# Patient Record
Sex: Female | Born: 1950 | Race: White | Hispanic: No | Smoking: Never smoker
Health system: Southern US, Community
[De-identification: ages and names within clinical notes are randomized; demographics above are authoritative.]

## PROBLEM LIST (undated history)

## (undated) DIAGNOSIS — G4700 Insomnia, unspecified: Secondary | ICD-10-CM

## (undated) DIAGNOSIS — I10 Essential (primary) hypertension: Secondary | ICD-10-CM

## (undated) HISTORY — DX: Insomnia, unspecified: G47.00

## (undated) HISTORY — DX: Essential (primary) hypertension: I10

---

## 1998-05-31 ENCOUNTER — Other Ambulatory Visit: Admission: RE | Admit: 1998-05-31 | Discharge: 1998-05-31 | Payer: Self-pay | Admitting: Obstetrics and Gynecology

## 1999-06-12 ENCOUNTER — Other Ambulatory Visit: Admission: RE | Admit: 1999-06-12 | Discharge: 1999-06-12 | Payer: Self-pay | Admitting: Obstetrics and Gynecology

## 1999-08-29 ENCOUNTER — Encounter: Admission: RE | Admit: 1999-08-29 | Discharge: 1999-08-29 | Payer: Self-pay | Admitting: Obstetrics and Gynecology

## 1999-08-29 ENCOUNTER — Encounter: Payer: Self-pay | Admitting: Obstetrics and Gynecology

## 2000-06-16 ENCOUNTER — Other Ambulatory Visit: Admission: RE | Admit: 2000-06-16 | Discharge: 2000-06-16 | Payer: Self-pay | Admitting: Obstetrics and Gynecology

## 2000-08-30 ENCOUNTER — Encounter: Payer: Self-pay | Admitting: Obstetrics and Gynecology

## 2000-08-30 ENCOUNTER — Encounter: Admission: RE | Admit: 2000-08-30 | Discharge: 2000-08-30 | Payer: Self-pay | Admitting: Obstetrics and Gynecology

## 2001-06-28 ENCOUNTER — Other Ambulatory Visit: Admission: RE | Admit: 2001-06-28 | Discharge: 2001-06-28 | Payer: Self-pay | Admitting: Obstetrics and Gynecology

## 2001-08-31 ENCOUNTER — Encounter: Payer: Self-pay | Admitting: Obstetrics and Gynecology

## 2001-08-31 ENCOUNTER — Encounter: Admission: RE | Admit: 2001-08-31 | Discharge: 2001-08-31 | Payer: Self-pay | Admitting: Obstetrics and Gynecology

## 2002-06-30 ENCOUNTER — Other Ambulatory Visit: Admission: RE | Admit: 2002-06-30 | Discharge: 2002-06-30 | Payer: Self-pay | Admitting: Obstetrics and Gynecology

## 2003-07-05 ENCOUNTER — Other Ambulatory Visit: Admission: RE | Admit: 2003-07-05 | Discharge: 2003-07-05 | Payer: Self-pay | Admitting: Obstetrics and Gynecology

## 2004-07-08 ENCOUNTER — Other Ambulatory Visit: Admission: RE | Admit: 2004-07-08 | Discharge: 2004-07-08 | Payer: Self-pay | Admitting: Obstetrics and Gynecology

## 2005-06-03 ENCOUNTER — Other Ambulatory Visit: Admission: RE | Admit: 2005-06-03 | Discharge: 2005-06-03 | Payer: Self-pay | Admitting: Obstetrics and Gynecology

## 2012-05-24 ENCOUNTER — Other Ambulatory Visit: Payer: Self-pay | Admitting: Obstetrics and Gynecology

## 2012-05-24 DIAGNOSIS — R928 Other abnormal and inconclusive findings on diagnostic imaging of breast: Secondary | ICD-10-CM

## 2012-05-26 ENCOUNTER — Ambulatory Visit
Admission: RE | Admit: 2012-05-26 | Discharge: 2012-05-26 | Disposition: A | Discharge status: Home / Self Care | Payer: BC Managed Care – PPO | Source: Ambulatory Visit | Attending: Obstetrics and Gynecology | Admitting: Obstetrics and Gynecology

## 2012-05-26 DIAGNOSIS — R928 Other abnormal and inconclusive findings on diagnostic imaging of breast: Secondary | ICD-10-CM

## 2015-09-17 DIAGNOSIS — M545 Low back pain: Secondary | ICD-10-CM | POA: Diagnosis not present

## 2015-09-17 DIAGNOSIS — F329 Major depressive disorder, single episode, unspecified: Secondary | ICD-10-CM | POA: Diagnosis not present

## 2015-09-17 DIAGNOSIS — R609 Edema, unspecified: Secondary | ICD-10-CM | POA: Diagnosis not present

## 2015-09-17 DIAGNOSIS — I10 Essential (primary) hypertension: Secondary | ICD-10-CM | POA: Diagnosis not present

## 2015-09-17 DIAGNOSIS — M159 Polyosteoarthritis, unspecified: Secondary | ICD-10-CM | POA: Diagnosis not present

## 2015-09-17 DIAGNOSIS — E7800 Pure hypercholesterolemia, unspecified: Secondary | ICD-10-CM | POA: Diagnosis not present

## 2015-09-17 DIAGNOSIS — K219 Gastro-esophageal reflux disease without esophagitis: Secondary | ICD-10-CM | POA: Diagnosis not present

## 2015-09-17 DIAGNOSIS — K581 Irritable bowel syndrome with constipation: Secondary | ICD-10-CM | POA: Diagnosis not present

## 2015-09-17 DIAGNOSIS — M25561 Pain in right knee: Secondary | ICD-10-CM | POA: Diagnosis not present

## 2015-09-17 DIAGNOSIS — G4700 Insomnia, unspecified: Secondary | ICD-10-CM | POA: Diagnosis not present

## 2015-09-17 DIAGNOSIS — E119 Type 2 diabetes mellitus without complications: Secondary | ICD-10-CM | POA: Diagnosis not present

## 2015-09-17 DIAGNOSIS — J309 Allergic rhinitis, unspecified: Secondary | ICD-10-CM | POA: Diagnosis not present

## 2015-10-17 DIAGNOSIS — G4700 Insomnia, unspecified: Secondary | ICD-10-CM | POA: Diagnosis not present

## 2015-10-17 DIAGNOSIS — R609 Edema, unspecified: Secondary | ICD-10-CM | POA: Diagnosis not present

## 2015-10-17 DIAGNOSIS — E119 Type 2 diabetes mellitus without complications: Secondary | ICD-10-CM | POA: Diagnosis not present

## 2015-10-17 DIAGNOSIS — I10 Essential (primary) hypertension: Secondary | ICD-10-CM | POA: Diagnosis not present

## 2015-10-17 DIAGNOSIS — M25561 Pain in right knee: Secondary | ICD-10-CM | POA: Diagnosis not present

## 2015-10-17 DIAGNOSIS — M159 Polyosteoarthritis, unspecified: Secondary | ICD-10-CM | POA: Diagnosis not present

## 2015-10-17 DIAGNOSIS — K581 Irritable bowel syndrome with constipation: Secondary | ICD-10-CM | POA: Diagnosis not present

## 2015-10-17 DIAGNOSIS — F329 Major depressive disorder, single episode, unspecified: Secondary | ICD-10-CM | POA: Diagnosis not present

## 2015-10-17 DIAGNOSIS — E7800 Pure hypercholesterolemia, unspecified: Secondary | ICD-10-CM | POA: Diagnosis not present

## 2015-10-17 DIAGNOSIS — K219 Gastro-esophageal reflux disease without esophagitis: Secondary | ICD-10-CM | POA: Diagnosis not present

## 2015-10-17 DIAGNOSIS — J309 Allergic rhinitis, unspecified: Secondary | ICD-10-CM | POA: Diagnosis not present

## 2015-10-17 DIAGNOSIS — M545 Low back pain: Secondary | ICD-10-CM | POA: Diagnosis not present

## 2015-12-19 DIAGNOSIS — R609 Edema, unspecified: Secondary | ICD-10-CM | POA: Diagnosis not present

## 2015-12-19 DIAGNOSIS — K581 Irritable bowel syndrome with constipation: Secondary | ICD-10-CM | POA: Diagnosis not present

## 2015-12-19 DIAGNOSIS — M159 Polyosteoarthritis, unspecified: Secondary | ICD-10-CM | POA: Diagnosis not present

## 2015-12-19 DIAGNOSIS — G4700 Insomnia, unspecified: Secondary | ICD-10-CM | POA: Diagnosis not present

## 2015-12-19 DIAGNOSIS — F419 Anxiety disorder, unspecified: Secondary | ICD-10-CM | POA: Diagnosis not present

## 2015-12-19 DIAGNOSIS — F329 Major depressive disorder, single episode, unspecified: Secondary | ICD-10-CM | POA: Diagnosis not present

## 2015-12-19 DIAGNOSIS — E119 Type 2 diabetes mellitus without complications: Secondary | ICD-10-CM | POA: Diagnosis not present

## 2015-12-19 DIAGNOSIS — E663 Overweight: Secondary | ICD-10-CM | POA: Diagnosis not present

## 2015-12-19 DIAGNOSIS — E7800 Pure hypercholesterolemia, unspecified: Secondary | ICD-10-CM | POA: Diagnosis not present

## 2015-12-19 DIAGNOSIS — I10 Essential (primary) hypertension: Secondary | ICD-10-CM | POA: Diagnosis not present

## 2015-12-19 DIAGNOSIS — K219 Gastro-esophageal reflux disease without esophagitis: Secondary | ICD-10-CM | POA: Diagnosis not present

## 2015-12-19 DIAGNOSIS — M545 Low back pain: Secondary | ICD-10-CM | POA: Diagnosis not present

## 2016-01-27 DIAGNOSIS — Z6829 Body mass index (BMI) 29.0-29.9, adult: Secondary | ICD-10-CM | POA: Diagnosis not present

## 2016-01-27 DIAGNOSIS — H1010 Acute atopic conjunctivitis, unspecified eye: Secondary | ICD-10-CM | POA: Diagnosis not present

## 2016-01-27 DIAGNOSIS — J309 Allergic rhinitis, unspecified: Secondary | ICD-10-CM | POA: Diagnosis not present

## 2016-01-31 DIAGNOSIS — M25561 Pain in right knee: Secondary | ICD-10-CM | POA: Diagnosis not present

## 2016-01-31 DIAGNOSIS — M2391 Unspecified internal derangement of right knee: Secondary | ICD-10-CM | POA: Diagnosis not present

## 2016-01-31 DIAGNOSIS — M94261 Chondromalacia, right knee: Secondary | ICD-10-CM | POA: Diagnosis not present

## 2016-02-05 DIAGNOSIS — N952 Postmenopausal atrophic vaginitis: Secondary | ICD-10-CM | POA: Diagnosis not present

## 2016-02-05 DIAGNOSIS — Z1231 Encounter for screening mammogram for malignant neoplasm of breast: Secondary | ICD-10-CM | POA: Diagnosis not present

## 2016-02-10 DIAGNOSIS — H02051 Trichiasis without entropian right upper eyelid: Secondary | ICD-10-CM | POA: Diagnosis not present

## 2016-02-24 DIAGNOSIS — M25551 Pain in right hip: Secondary | ICD-10-CM | POA: Diagnosis not present

## 2016-03-04 DIAGNOSIS — M94261 Chondromalacia, right knee: Secondary | ICD-10-CM | POA: Diagnosis not present

## 2016-03-24 DIAGNOSIS — F419 Anxiety disorder, unspecified: Secondary | ICD-10-CM | POA: Diagnosis not present

## 2016-03-24 DIAGNOSIS — K219 Gastro-esophageal reflux disease without esophagitis: Secondary | ICD-10-CM | POA: Diagnosis not present

## 2016-03-24 DIAGNOSIS — M159 Polyosteoarthritis, unspecified: Secondary | ICD-10-CM | POA: Diagnosis not present

## 2016-03-24 DIAGNOSIS — E119 Type 2 diabetes mellitus without complications: Secondary | ICD-10-CM | POA: Diagnosis not present

## 2016-03-24 DIAGNOSIS — I10 Essential (primary) hypertension: Secondary | ICD-10-CM | POA: Diagnosis not present

## 2016-03-24 DIAGNOSIS — R609 Edema, unspecified: Secondary | ICD-10-CM | POA: Diagnosis not present

## 2016-03-24 DIAGNOSIS — M545 Low back pain: Secondary | ICD-10-CM | POA: Diagnosis not present

## 2016-03-24 DIAGNOSIS — F329 Major depressive disorder, single episode, unspecified: Secondary | ICD-10-CM | POA: Diagnosis not present

## 2016-03-24 DIAGNOSIS — E7800 Pure hypercholesterolemia, unspecified: Secondary | ICD-10-CM | POA: Diagnosis not present

## 2016-03-24 DIAGNOSIS — K581 Irritable bowel syndrome with constipation: Secondary | ICD-10-CM | POA: Diagnosis not present

## 2016-03-24 DIAGNOSIS — G4700 Insomnia, unspecified: Secondary | ICD-10-CM | POA: Diagnosis not present

## 2016-03-24 DIAGNOSIS — E876 Hypokalemia: Secondary | ICD-10-CM | POA: Diagnosis not present

## 2016-04-22 DIAGNOSIS — M94261 Chondromalacia, right knee: Secondary | ICD-10-CM | POA: Diagnosis not present

## 2016-04-22 DIAGNOSIS — M7631 Iliotibial band syndrome, right leg: Secondary | ICD-10-CM | POA: Diagnosis not present

## 2016-06-30 DIAGNOSIS — G4700 Insomnia, unspecified: Secondary | ICD-10-CM | POA: Diagnosis not present

## 2016-06-30 DIAGNOSIS — K581 Irritable bowel syndrome with constipation: Secondary | ICD-10-CM | POA: Diagnosis not present

## 2016-06-30 DIAGNOSIS — M545 Low back pain: Secondary | ICD-10-CM | POA: Diagnosis not present

## 2016-06-30 DIAGNOSIS — K219 Gastro-esophageal reflux disease without esophagitis: Secondary | ICD-10-CM | POA: Diagnosis not present

## 2016-06-30 DIAGNOSIS — R609 Edema, unspecified: Secondary | ICD-10-CM | POA: Diagnosis not present

## 2016-06-30 DIAGNOSIS — E119 Type 2 diabetes mellitus without complications: Secondary | ICD-10-CM | POA: Diagnosis not present

## 2016-06-30 DIAGNOSIS — E876 Hypokalemia: Secondary | ICD-10-CM | POA: Diagnosis not present

## 2016-06-30 DIAGNOSIS — E7800 Pure hypercholesterolemia, unspecified: Secondary | ICD-10-CM | POA: Diagnosis not present

## 2016-06-30 DIAGNOSIS — M159 Polyosteoarthritis, unspecified: Secondary | ICD-10-CM | POA: Diagnosis not present

## 2016-06-30 DIAGNOSIS — F329 Major depressive disorder, single episode, unspecified: Secondary | ICD-10-CM | POA: Diagnosis not present

## 2016-06-30 DIAGNOSIS — I10 Essential (primary) hypertension: Secondary | ICD-10-CM | POA: Diagnosis not present

## 2016-06-30 DIAGNOSIS — F419 Anxiety disorder, unspecified: Secondary | ICD-10-CM | POA: Diagnosis not present

## 2016-07-14 DIAGNOSIS — E119 Type 2 diabetes mellitus without complications: Secondary | ICD-10-CM | POA: Diagnosis not present

## 2016-07-14 DIAGNOSIS — G4700 Insomnia, unspecified: Secondary | ICD-10-CM | POA: Diagnosis not present

## 2016-07-14 DIAGNOSIS — K219 Gastro-esophageal reflux disease without esophagitis: Secondary | ICD-10-CM | POA: Diagnosis not present

## 2016-07-14 DIAGNOSIS — M159 Polyosteoarthritis, unspecified: Secondary | ICD-10-CM | POA: Diagnosis not present

## 2016-07-14 DIAGNOSIS — K581 Irritable bowel syndrome with constipation: Secondary | ICD-10-CM | POA: Diagnosis not present

## 2016-07-14 DIAGNOSIS — M545 Low back pain: Secondary | ICD-10-CM | POA: Diagnosis not present

## 2016-07-14 DIAGNOSIS — Z9181 History of falling: Secondary | ICD-10-CM | POA: Diagnosis not present

## 2016-07-14 DIAGNOSIS — I10 Essential (primary) hypertension: Secondary | ICD-10-CM | POA: Diagnosis not present

## 2016-07-14 DIAGNOSIS — E7800 Pure hypercholesterolemia, unspecified: Secondary | ICD-10-CM | POA: Diagnosis not present

## 2016-07-14 DIAGNOSIS — R609 Edema, unspecified: Secondary | ICD-10-CM | POA: Diagnosis not present

## 2016-07-14 DIAGNOSIS — F329 Major depressive disorder, single episode, unspecified: Secondary | ICD-10-CM | POA: Diagnosis not present

## 2016-07-14 DIAGNOSIS — E876 Hypokalemia: Secondary | ICD-10-CM | POA: Diagnosis not present

## 2016-08-05 DIAGNOSIS — M25561 Pain in right knee: Secondary | ICD-10-CM | POA: Diagnosis not present

## 2016-08-05 DIAGNOSIS — M2391 Unspecified internal derangement of right knee: Secondary | ICD-10-CM | POA: Diagnosis not present

## 2016-08-05 DIAGNOSIS — M222X1 Patellofemoral disorders, right knee: Secondary | ICD-10-CM | POA: Diagnosis not present

## 2016-09-15 DIAGNOSIS — Z1211 Encounter for screening for malignant neoplasm of colon: Secondary | ICD-10-CM | POA: Diagnosis not present

## 2016-09-15 DIAGNOSIS — Z8371 Family history of colonic polyps: Secondary | ICD-10-CM | POA: Diagnosis not present

## 2016-09-15 DIAGNOSIS — Z86010 Personal history of colonic polyps: Secondary | ICD-10-CM | POA: Diagnosis not present

## 2016-10-07 DIAGNOSIS — K649 Unspecified hemorrhoids: Secondary | ICD-10-CM | POA: Diagnosis not present

## 2016-10-07 DIAGNOSIS — Z8371 Family history of colonic polyps: Secondary | ICD-10-CM | POA: Diagnosis not present

## 2016-10-07 DIAGNOSIS — Z1211 Encounter for screening for malignant neoplasm of colon: Secondary | ICD-10-CM | POA: Diagnosis not present

## 2016-10-07 DIAGNOSIS — K5730 Diverticulosis of large intestine without perforation or abscess without bleeding: Secondary | ICD-10-CM | POA: Diagnosis not present

## 2016-10-07 DIAGNOSIS — K644 Residual hemorrhoidal skin tags: Secondary | ICD-10-CM | POA: Diagnosis not present

## 2016-10-07 DIAGNOSIS — Z86010 Personal history of colonic polyps: Secondary | ICD-10-CM | POA: Diagnosis not present

## 2016-10-28 DIAGNOSIS — E119 Type 2 diabetes mellitus without complications: Secondary | ICD-10-CM | POA: Diagnosis not present

## 2016-10-28 DIAGNOSIS — F329 Major depressive disorder, single episode, unspecified: Secondary | ICD-10-CM | POA: Diagnosis not present

## 2016-10-28 DIAGNOSIS — K581 Irritable bowel syndrome with constipation: Secondary | ICD-10-CM | POA: Diagnosis not present

## 2016-10-28 DIAGNOSIS — M545 Low back pain: Secondary | ICD-10-CM | POA: Diagnosis not present

## 2016-10-28 DIAGNOSIS — F419 Anxiety disorder, unspecified: Secondary | ICD-10-CM | POA: Diagnosis not present

## 2016-10-28 DIAGNOSIS — K219 Gastro-esophageal reflux disease without esophagitis: Secondary | ICD-10-CM | POA: Diagnosis not present

## 2016-10-28 DIAGNOSIS — E876 Hypokalemia: Secondary | ICD-10-CM | POA: Diagnosis not present

## 2016-10-28 DIAGNOSIS — E7800 Pure hypercholesterolemia, unspecified: Secondary | ICD-10-CM | POA: Diagnosis not present

## 2016-10-28 DIAGNOSIS — R609 Edema, unspecified: Secondary | ICD-10-CM | POA: Diagnosis not present

## 2016-10-28 DIAGNOSIS — I10 Essential (primary) hypertension: Secondary | ICD-10-CM | POA: Diagnosis not present

## 2016-10-28 DIAGNOSIS — G4700 Insomnia, unspecified: Secondary | ICD-10-CM | POA: Diagnosis not present

## 2016-10-28 DIAGNOSIS — M159 Polyosteoarthritis, unspecified: Secondary | ICD-10-CM | POA: Diagnosis not present

## 2017-02-01 DIAGNOSIS — K219 Gastro-esophageal reflux disease without esophagitis: Secondary | ICD-10-CM | POA: Diagnosis not present

## 2017-02-01 DIAGNOSIS — M159 Polyosteoarthritis, unspecified: Secondary | ICD-10-CM | POA: Diagnosis not present

## 2017-02-01 DIAGNOSIS — G4489 Other headache syndrome: Secondary | ICD-10-CM | POA: Diagnosis not present

## 2017-02-01 DIAGNOSIS — R609 Edema, unspecified: Secondary | ICD-10-CM | POA: Diagnosis not present

## 2017-02-01 DIAGNOSIS — K581 Irritable bowel syndrome with constipation: Secondary | ICD-10-CM | POA: Diagnosis not present

## 2017-02-01 DIAGNOSIS — E7800 Pure hypercholesterolemia, unspecified: Secondary | ICD-10-CM | POA: Diagnosis not present

## 2017-02-01 DIAGNOSIS — F419 Anxiety disorder, unspecified: Secondary | ICD-10-CM | POA: Diagnosis not present

## 2017-02-01 DIAGNOSIS — E119 Type 2 diabetes mellitus without complications: Secondary | ICD-10-CM | POA: Diagnosis not present

## 2017-02-01 DIAGNOSIS — E876 Hypokalemia: Secondary | ICD-10-CM | POA: Diagnosis not present

## 2017-02-01 DIAGNOSIS — G4700 Insomnia, unspecified: Secondary | ICD-10-CM | POA: Diagnosis not present

## 2017-02-01 DIAGNOSIS — M545 Low back pain: Secondary | ICD-10-CM | POA: Diagnosis not present

## 2017-02-01 DIAGNOSIS — I10 Essential (primary) hypertension: Secondary | ICD-10-CM | POA: Diagnosis not present

## 2017-02-01 DIAGNOSIS — F329 Major depressive disorder, single episode, unspecified: Secondary | ICD-10-CM | POA: Diagnosis not present

## 2017-02-08 DIAGNOSIS — Z1231 Encounter for screening mammogram for malignant neoplasm of breast: Secondary | ICD-10-CM | POA: Diagnosis not present

## 2017-02-08 DIAGNOSIS — N952 Postmenopausal atrophic vaginitis: Secondary | ICD-10-CM | POA: Diagnosis not present

## 2017-02-10 DIAGNOSIS — M2391 Unspecified internal derangement of right knee: Secondary | ICD-10-CM | POA: Diagnosis not present

## 2017-02-10 DIAGNOSIS — M94261 Chondromalacia, right knee: Secondary | ICD-10-CM | POA: Diagnosis not present

## 2017-02-10 DIAGNOSIS — M25561 Pain in right knee: Secondary | ICD-10-CM | POA: Diagnosis not present

## 2017-02-10 DIAGNOSIS — M25562 Pain in left knee: Secondary | ICD-10-CM | POA: Diagnosis not present

## 2017-02-15 DIAGNOSIS — H02052 Trichiasis without entropian right lower eyelid: Secondary | ICD-10-CM | POA: Diagnosis not present

## 2017-05-04 DIAGNOSIS — M159 Polyosteoarthritis, unspecified: Secondary | ICD-10-CM | POA: Diagnosis not present

## 2017-05-04 DIAGNOSIS — I10 Essential (primary) hypertension: Secondary | ICD-10-CM | POA: Diagnosis not present

## 2017-05-04 DIAGNOSIS — K219 Gastro-esophageal reflux disease without esophagitis: Secondary | ICD-10-CM | POA: Diagnosis not present

## 2017-05-04 DIAGNOSIS — K581 Irritable bowel syndrome with constipation: Secondary | ICD-10-CM | POA: Diagnosis not present

## 2017-05-04 DIAGNOSIS — M545 Low back pain: Secondary | ICD-10-CM | POA: Diagnosis not present

## 2017-05-04 DIAGNOSIS — R609 Edema, unspecified: Secondary | ICD-10-CM | POA: Diagnosis not present

## 2017-05-04 DIAGNOSIS — F329 Major depressive disorder, single episode, unspecified: Secondary | ICD-10-CM | POA: Diagnosis not present

## 2017-05-04 DIAGNOSIS — E876 Hypokalemia: Secondary | ICD-10-CM | POA: Diagnosis not present

## 2017-05-04 DIAGNOSIS — G4700 Insomnia, unspecified: Secondary | ICD-10-CM | POA: Diagnosis not present

## 2017-05-04 DIAGNOSIS — E1140 Type 2 diabetes mellitus with diabetic neuropathy, unspecified: Secondary | ICD-10-CM | POA: Diagnosis not present

## 2017-05-04 DIAGNOSIS — G43909 Migraine, unspecified, not intractable, without status migrainosus: Secondary | ICD-10-CM | POA: Diagnosis not present

## 2017-05-04 DIAGNOSIS — E7800 Pure hypercholesterolemia, unspecified: Secondary | ICD-10-CM | POA: Diagnosis not present

## 2017-05-04 DIAGNOSIS — E119 Type 2 diabetes mellitus without complications: Secondary | ICD-10-CM | POA: Diagnosis not present

## 2017-08-03 DIAGNOSIS — I10 Essential (primary) hypertension: Secondary | ICD-10-CM | POA: Diagnosis not present

## 2017-08-03 DIAGNOSIS — G4700 Insomnia, unspecified: Secondary | ICD-10-CM | POA: Diagnosis not present

## 2017-08-03 DIAGNOSIS — M159 Polyosteoarthritis, unspecified: Secondary | ICD-10-CM | POA: Diagnosis not present

## 2017-08-03 DIAGNOSIS — R609 Edema, unspecified: Secondary | ICD-10-CM | POA: Diagnosis not present

## 2017-08-03 DIAGNOSIS — E119 Type 2 diabetes mellitus without complications: Secondary | ICD-10-CM | POA: Diagnosis not present

## 2017-08-03 DIAGNOSIS — F329 Major depressive disorder, single episode, unspecified: Secondary | ICD-10-CM | POA: Diagnosis not present

## 2017-08-03 DIAGNOSIS — J309 Allergic rhinitis, unspecified: Secondary | ICD-10-CM | POA: Diagnosis not present

## 2017-08-03 DIAGNOSIS — K219 Gastro-esophageal reflux disease without esophagitis: Secondary | ICD-10-CM | POA: Diagnosis not present

## 2017-08-03 DIAGNOSIS — E876 Hypokalemia: Secondary | ICD-10-CM | POA: Diagnosis not present

## 2017-08-03 DIAGNOSIS — K581 Irritable bowel syndrome with constipation: Secondary | ICD-10-CM | POA: Diagnosis not present

## 2017-08-03 DIAGNOSIS — E7800 Pure hypercholesterolemia, unspecified: Secondary | ICD-10-CM | POA: Diagnosis not present

## 2017-08-03 DIAGNOSIS — G43909 Migraine, unspecified, not intractable, without status migrainosus: Secondary | ICD-10-CM | POA: Diagnosis not present

## 2017-08-03 DIAGNOSIS — M545 Low back pain: Secondary | ICD-10-CM | POA: Diagnosis not present

## 2017-08-31 DIAGNOSIS — I10 Essential (primary) hypertension: Secondary | ICD-10-CM | POA: Diagnosis not present

## 2017-08-31 DIAGNOSIS — E1140 Type 2 diabetes mellitus with diabetic neuropathy, unspecified: Secondary | ICD-10-CM | POA: Diagnosis not present

## 2017-08-31 DIAGNOSIS — J208 Acute bronchitis due to other specified organisms: Secondary | ICD-10-CM | POA: Diagnosis not present

## 2017-08-31 DIAGNOSIS — E119 Type 2 diabetes mellitus without complications: Secondary | ICD-10-CM | POA: Diagnosis not present

## 2017-08-31 DIAGNOSIS — E876 Hypokalemia: Secondary | ICD-10-CM | POA: Diagnosis not present

## 2017-08-31 DIAGNOSIS — M159 Polyosteoarthritis, unspecified: Secondary | ICD-10-CM | POA: Diagnosis not present

## 2017-08-31 DIAGNOSIS — G4700 Insomnia, unspecified: Secondary | ICD-10-CM | POA: Diagnosis not present

## 2017-08-31 DIAGNOSIS — M545 Low back pain: Secondary | ICD-10-CM | POA: Diagnosis not present

## 2017-08-31 DIAGNOSIS — F329 Major depressive disorder, single episode, unspecified: Secondary | ICD-10-CM | POA: Diagnosis not present

## 2017-08-31 DIAGNOSIS — K219 Gastro-esophageal reflux disease without esophagitis: Secondary | ICD-10-CM | POA: Diagnosis not present

## 2017-08-31 DIAGNOSIS — K581 Irritable bowel syndrome with constipation: Secondary | ICD-10-CM | POA: Diagnosis not present

## 2017-08-31 DIAGNOSIS — G43909 Migraine, unspecified, not intractable, without status migrainosus: Secondary | ICD-10-CM | POA: Diagnosis not present

## 2017-10-06 DIAGNOSIS — K136 Irritative hyperplasia of oral mucosa: Secondary | ICD-10-CM | POA: Diagnosis not present

## 2017-10-15 DIAGNOSIS — K136 Irritative hyperplasia of oral mucosa: Secondary | ICD-10-CM | POA: Diagnosis not present

## 2017-11-02 DIAGNOSIS — M545 Low back pain: Secondary | ICD-10-CM | POA: Diagnosis not present

## 2017-11-02 DIAGNOSIS — F419 Anxiety disorder, unspecified: Secondary | ICD-10-CM | POA: Diagnosis not present

## 2017-11-02 DIAGNOSIS — Z1331 Encounter for screening for depression: Secondary | ICD-10-CM | POA: Diagnosis not present

## 2017-11-02 DIAGNOSIS — I10 Essential (primary) hypertension: Secondary | ICD-10-CM | POA: Diagnosis not present

## 2017-11-02 DIAGNOSIS — Z9181 History of falling: Secondary | ICD-10-CM | POA: Diagnosis not present

## 2017-11-02 DIAGNOSIS — M159 Polyosteoarthritis, unspecified: Secondary | ICD-10-CM | POA: Diagnosis not present

## 2017-11-02 DIAGNOSIS — E7800 Pure hypercholesterolemia, unspecified: Secondary | ICD-10-CM | POA: Diagnosis not present

## 2017-11-02 DIAGNOSIS — Z6829 Body mass index (BMI) 29.0-29.9, adult: Secondary | ICD-10-CM | POA: Diagnosis not present

## 2017-11-02 DIAGNOSIS — F329 Major depressive disorder, single episode, unspecified: Secondary | ICD-10-CM | POA: Diagnosis not present

## 2017-11-02 DIAGNOSIS — E119 Type 2 diabetes mellitus without complications: Secondary | ICD-10-CM | POA: Diagnosis not present

## 2017-11-02 DIAGNOSIS — E876 Hypokalemia: Secondary | ICD-10-CM | POA: Diagnosis not present

## 2017-11-09 DIAGNOSIS — Z9181 History of falling: Secondary | ICD-10-CM | POA: Diagnosis not present

## 2017-11-09 DIAGNOSIS — Z1331 Encounter for screening for depression: Secondary | ICD-10-CM | POA: Diagnosis not present

## 2017-11-09 DIAGNOSIS — E119 Type 2 diabetes mellitus without complications: Secondary | ICD-10-CM | POA: Diagnosis not present

## 2017-11-09 DIAGNOSIS — E7800 Pure hypercholesterolemia, unspecified: Secondary | ICD-10-CM | POA: Diagnosis not present

## 2017-11-09 DIAGNOSIS — E1140 Type 2 diabetes mellitus with diabetic neuropathy, unspecified: Secondary | ICD-10-CM | POA: Diagnosis not present

## 2017-11-09 DIAGNOSIS — G4700 Insomnia, unspecified: Secondary | ICD-10-CM | POA: Diagnosis not present

## 2017-11-09 DIAGNOSIS — I10 Essential (primary) hypertension: Secondary | ICD-10-CM | POA: Diagnosis not present

## 2017-11-09 DIAGNOSIS — E876 Hypokalemia: Secondary | ICD-10-CM | POA: Diagnosis not present

## 2017-11-09 DIAGNOSIS — M545 Low back pain: Secondary | ICD-10-CM | POA: Diagnosis not present

## 2017-11-09 DIAGNOSIS — R5382 Chronic fatigue, unspecified: Secondary | ICD-10-CM | POA: Diagnosis not present

## 2017-11-09 DIAGNOSIS — G43909 Migraine, unspecified, not intractable, without status migrainosus: Secondary | ICD-10-CM | POA: Diagnosis not present

## 2017-11-09 DIAGNOSIS — M159 Polyosteoarthritis, unspecified: Secondary | ICD-10-CM | POA: Diagnosis not present

## 2017-11-09 DIAGNOSIS — K219 Gastro-esophageal reflux disease without esophagitis: Secondary | ICD-10-CM | POA: Diagnosis not present

## 2017-11-11 DIAGNOSIS — M159 Polyosteoarthritis, unspecified: Secondary | ICD-10-CM | POA: Diagnosis not present

## 2017-11-11 DIAGNOSIS — E1140 Type 2 diabetes mellitus with diabetic neuropathy, unspecified: Secondary | ICD-10-CM | POA: Diagnosis not present

## 2017-11-11 DIAGNOSIS — J0100 Acute maxillary sinusitis, unspecified: Secondary | ICD-10-CM | POA: Diagnosis not present

## 2017-11-11 DIAGNOSIS — G4700 Insomnia, unspecified: Secondary | ICD-10-CM | POA: Diagnosis not present

## 2017-11-11 DIAGNOSIS — F329 Major depressive disorder, single episode, unspecified: Secondary | ICD-10-CM | POA: Diagnosis not present

## 2017-11-11 DIAGNOSIS — K219 Gastro-esophageal reflux disease without esophagitis: Secondary | ICD-10-CM | POA: Diagnosis not present

## 2017-11-11 DIAGNOSIS — M545 Low back pain: Secondary | ICD-10-CM | POA: Diagnosis not present

## 2017-11-11 DIAGNOSIS — R609 Edema, unspecified: Secondary | ICD-10-CM | POA: Diagnosis not present

## 2017-11-11 DIAGNOSIS — K581 Irritable bowel syndrome with constipation: Secondary | ICD-10-CM | POA: Diagnosis not present

## 2017-11-11 DIAGNOSIS — G43909 Migraine, unspecified, not intractable, without status migrainosus: Secondary | ICD-10-CM | POA: Diagnosis not present

## 2017-11-11 DIAGNOSIS — R42 Dizziness and giddiness: Secondary | ICD-10-CM | POA: Diagnosis not present

## 2017-11-11 DIAGNOSIS — E876 Hypokalemia: Secondary | ICD-10-CM | POA: Diagnosis not present

## 2017-12-07 DIAGNOSIS — K581 Irritable bowel syndrome with constipation: Secondary | ICD-10-CM | POA: Diagnosis not present

## 2017-12-07 DIAGNOSIS — E1140 Type 2 diabetes mellitus with diabetic neuropathy, unspecified: Secondary | ICD-10-CM | POA: Diagnosis not present

## 2017-12-07 DIAGNOSIS — I10 Essential (primary) hypertension: Secondary | ICD-10-CM | POA: Diagnosis not present

## 2017-12-07 DIAGNOSIS — F329 Major depressive disorder, single episode, unspecified: Secondary | ICD-10-CM | POA: Diagnosis not present

## 2017-12-07 DIAGNOSIS — R609 Edema, unspecified: Secondary | ICD-10-CM | POA: Diagnosis not present

## 2017-12-07 DIAGNOSIS — G4700 Insomnia, unspecified: Secondary | ICD-10-CM | POA: Diagnosis not present

## 2017-12-07 DIAGNOSIS — K219 Gastro-esophageal reflux disease without esophagitis: Secondary | ICD-10-CM | POA: Diagnosis not present

## 2017-12-07 DIAGNOSIS — M545 Low back pain: Secondary | ICD-10-CM | POA: Diagnosis not present

## 2017-12-07 DIAGNOSIS — E119 Type 2 diabetes mellitus without complications: Secondary | ICD-10-CM | POA: Diagnosis not present

## 2017-12-07 DIAGNOSIS — G43909 Migraine, unspecified, not intractable, without status migrainosus: Secondary | ICD-10-CM | POA: Diagnosis not present

## 2017-12-07 DIAGNOSIS — M159 Polyosteoarthritis, unspecified: Secondary | ICD-10-CM | POA: Diagnosis not present

## 2017-12-07 DIAGNOSIS — E876 Hypokalemia: Secondary | ICD-10-CM | POA: Diagnosis not present

## 2017-12-15 DIAGNOSIS — E119 Type 2 diabetes mellitus without complications: Secondary | ICD-10-CM | POA: Diagnosis not present

## 2017-12-15 DIAGNOSIS — M545 Low back pain: Secondary | ICD-10-CM | POA: Diagnosis not present

## 2017-12-15 DIAGNOSIS — N289 Disorder of kidney and ureter, unspecified: Secondary | ICD-10-CM | POA: Diagnosis not present

## 2017-12-15 DIAGNOSIS — G4700 Insomnia, unspecified: Secondary | ICD-10-CM | POA: Diagnosis not present

## 2017-12-15 DIAGNOSIS — M159 Polyosteoarthritis, unspecified: Secondary | ICD-10-CM | POA: Diagnosis not present

## 2017-12-15 DIAGNOSIS — F329 Major depressive disorder, single episode, unspecified: Secondary | ICD-10-CM | POA: Diagnosis not present

## 2017-12-15 DIAGNOSIS — G43909 Migraine, unspecified, not intractable, without status migrainosus: Secondary | ICD-10-CM | POA: Diagnosis not present

## 2017-12-15 DIAGNOSIS — I10 Essential (primary) hypertension: Secondary | ICD-10-CM | POA: Diagnosis not present

## 2017-12-15 DIAGNOSIS — E1140 Type 2 diabetes mellitus with diabetic neuropathy, unspecified: Secondary | ICD-10-CM | POA: Diagnosis not present

## 2017-12-15 DIAGNOSIS — K219 Gastro-esophageal reflux disease without esophagitis: Secondary | ICD-10-CM | POA: Diagnosis not present

## 2017-12-15 DIAGNOSIS — Z23 Encounter for immunization: Secondary | ICD-10-CM | POA: Diagnosis not present

## 2017-12-15 DIAGNOSIS — E876 Hypokalemia: Secondary | ICD-10-CM | POA: Diagnosis not present

## 2018-02-10 DIAGNOSIS — Z1231 Encounter for screening mammogram for malignant neoplasm of breast: Secondary | ICD-10-CM | POA: Diagnosis not present

## 2018-03-08 DIAGNOSIS — I10 Essential (primary) hypertension: Secondary | ICD-10-CM | POA: Diagnosis not present

## 2018-03-08 DIAGNOSIS — Z1331 Encounter for screening for depression: Secondary | ICD-10-CM | POA: Diagnosis not present

## 2018-03-08 DIAGNOSIS — Z9181 History of falling: Secondary | ICD-10-CM | POA: Diagnosis not present

## 2018-03-08 DIAGNOSIS — E785 Hyperlipidemia, unspecified: Secondary | ICD-10-CM | POA: Diagnosis not present

## 2018-03-08 DIAGNOSIS — K219 Gastro-esophageal reflux disease without esophagitis: Secondary | ICD-10-CM | POA: Diagnosis not present

## 2018-03-08 DIAGNOSIS — N259 Disorder resulting from impaired renal tubular function, unspecified: Secondary | ICD-10-CM | POA: Diagnosis not present

## 2018-03-08 DIAGNOSIS — M159 Polyosteoarthritis, unspecified: Secondary | ICD-10-CM | POA: Diagnosis not present

## 2018-03-08 DIAGNOSIS — Z139 Encounter for screening, unspecified: Secondary | ICD-10-CM | POA: Diagnosis not present

## 2018-03-08 DIAGNOSIS — E7800 Pure hypercholesterolemia, unspecified: Secondary | ICD-10-CM | POA: Diagnosis not present

## 2018-03-08 DIAGNOSIS — E876 Hypokalemia: Secondary | ICD-10-CM | POA: Diagnosis not present

## 2018-03-08 DIAGNOSIS — Z0000 Encounter for general adult medical examination without abnormal findings: Secondary | ICD-10-CM | POA: Diagnosis not present

## 2018-03-08 DIAGNOSIS — E119 Type 2 diabetes mellitus without complications: Secondary | ICD-10-CM | POA: Diagnosis not present

## 2018-03-08 DIAGNOSIS — E1140 Type 2 diabetes mellitus with diabetic neuropathy, unspecified: Secondary | ICD-10-CM | POA: Diagnosis not present

## 2018-05-02 DIAGNOSIS — G43909 Migraine, unspecified, not intractable, without status migrainosus: Secondary | ICD-10-CM | POA: Diagnosis not present

## 2018-05-02 DIAGNOSIS — K581 Irritable bowel syndrome with constipation: Secondary | ICD-10-CM | POA: Diagnosis not present

## 2018-05-02 DIAGNOSIS — E876 Hypokalemia: Secondary | ICD-10-CM | POA: Diagnosis not present

## 2018-05-02 DIAGNOSIS — E1140 Type 2 diabetes mellitus with diabetic neuropathy, unspecified: Secondary | ICD-10-CM | POA: Diagnosis not present

## 2018-05-02 DIAGNOSIS — N289 Disorder of kidney and ureter, unspecified: Secondary | ICD-10-CM | POA: Diagnosis not present

## 2018-05-02 DIAGNOSIS — R609 Edema, unspecified: Secondary | ICD-10-CM | POA: Diagnosis not present

## 2018-05-02 DIAGNOSIS — G4700 Insomnia, unspecified: Secondary | ICD-10-CM | POA: Diagnosis not present

## 2018-05-02 DIAGNOSIS — K219 Gastro-esophageal reflux disease without esophagitis: Secondary | ICD-10-CM | POA: Diagnosis not present

## 2018-05-02 DIAGNOSIS — F329 Major depressive disorder, single episode, unspecified: Secondary | ICD-10-CM | POA: Diagnosis not present

## 2018-05-02 DIAGNOSIS — M159 Polyosteoarthritis, unspecified: Secondary | ICD-10-CM | POA: Diagnosis not present

## 2018-05-02 DIAGNOSIS — J0100 Acute maxillary sinusitis, unspecified: Secondary | ICD-10-CM | POA: Diagnosis not present

## 2018-05-02 DIAGNOSIS — M545 Low back pain: Secondary | ICD-10-CM | POA: Diagnosis not present

## 2018-06-08 DIAGNOSIS — E7800 Pure hypercholesterolemia, unspecified: Secondary | ICD-10-CM | POA: Diagnosis not present

## 2018-06-08 DIAGNOSIS — E1140 Type 2 diabetes mellitus with diabetic neuropathy, unspecified: Secondary | ICD-10-CM | POA: Diagnosis not present

## 2018-06-08 DIAGNOSIS — G4700 Insomnia, unspecified: Secondary | ICD-10-CM | POA: Diagnosis not present

## 2018-06-08 DIAGNOSIS — M159 Polyosteoarthritis, unspecified: Secondary | ICD-10-CM | POA: Diagnosis not present

## 2018-06-08 DIAGNOSIS — K219 Gastro-esophageal reflux disease without esophagitis: Secondary | ICD-10-CM | POA: Diagnosis not present

## 2018-06-08 DIAGNOSIS — E876 Hypokalemia: Secondary | ICD-10-CM | POA: Diagnosis not present

## 2018-06-08 DIAGNOSIS — I10 Essential (primary) hypertension: Secondary | ICD-10-CM | POA: Diagnosis not present

## 2018-06-08 DIAGNOSIS — N289 Disorder of kidney and ureter, unspecified: Secondary | ICD-10-CM | POA: Diagnosis not present

## 2018-06-08 DIAGNOSIS — G43909 Migraine, unspecified, not intractable, without status migrainosus: Secondary | ICD-10-CM | POA: Diagnosis not present

## 2018-06-08 DIAGNOSIS — E118 Type 2 diabetes mellitus with unspecified complications: Secondary | ICD-10-CM | POA: Diagnosis not present

## 2018-06-08 DIAGNOSIS — M545 Low back pain: Secondary | ICD-10-CM | POA: Diagnosis not present

## 2018-06-08 DIAGNOSIS — Z1339 Encounter for screening examination for other mental health and behavioral disorders: Secondary | ICD-10-CM | POA: Diagnosis not present

## 2018-07-15 DIAGNOSIS — Z23 Encounter for immunization: Secondary | ICD-10-CM | POA: Diagnosis not present

## 2018-09-13 DIAGNOSIS — E876 Hypokalemia: Secondary | ICD-10-CM | POA: Diagnosis not present

## 2018-09-13 DIAGNOSIS — G43909 Migraine, unspecified, not intractable, without status migrainosus: Secondary | ICD-10-CM | POA: Diagnosis not present

## 2018-09-13 DIAGNOSIS — F329 Major depressive disorder, single episode, unspecified: Secondary | ICD-10-CM | POA: Diagnosis not present

## 2018-09-13 DIAGNOSIS — M545 Low back pain: Secondary | ICD-10-CM | POA: Diagnosis not present

## 2018-09-13 DIAGNOSIS — G4700 Insomnia, unspecified: Secondary | ICD-10-CM | POA: Diagnosis not present

## 2018-09-13 DIAGNOSIS — E7800 Pure hypercholesterolemia, unspecified: Secondary | ICD-10-CM | POA: Diagnosis not present

## 2018-09-13 DIAGNOSIS — R609 Edema, unspecified: Secondary | ICD-10-CM | POA: Diagnosis not present

## 2018-09-13 DIAGNOSIS — K219 Gastro-esophageal reflux disease without esophagitis: Secondary | ICD-10-CM | POA: Diagnosis not present

## 2018-09-13 DIAGNOSIS — E1140 Type 2 diabetes mellitus with diabetic neuropathy, unspecified: Secondary | ICD-10-CM | POA: Diagnosis not present

## 2018-09-13 DIAGNOSIS — N183 Chronic kidney disease, stage 3 (moderate): Secondary | ICD-10-CM | POA: Diagnosis not present

## 2018-09-13 DIAGNOSIS — E118 Type 2 diabetes mellitus with unspecified complications: Secondary | ICD-10-CM | POA: Diagnosis not present

## 2018-09-13 DIAGNOSIS — F419 Anxiety disorder, unspecified: Secondary | ICD-10-CM | POA: Diagnosis not present

## 2018-09-13 DIAGNOSIS — M159 Polyosteoarthritis, unspecified: Secondary | ICD-10-CM | POA: Diagnosis not present

## 2018-09-13 DIAGNOSIS — I10 Essential (primary) hypertension: Secondary | ICD-10-CM | POA: Diagnosis not present

## 2018-09-13 DIAGNOSIS — K581 Irritable bowel syndrome with constipation: Secondary | ICD-10-CM | POA: Diagnosis not present

## 2018-10-03 DIAGNOSIS — K581 Irritable bowel syndrome with constipation: Secondary | ICD-10-CM | POA: Diagnosis not present

## 2018-10-03 DIAGNOSIS — M159 Polyosteoarthritis, unspecified: Secondary | ICD-10-CM | POA: Diagnosis not present

## 2018-10-03 DIAGNOSIS — G43909 Migraine, unspecified, not intractable, without status migrainosus: Secondary | ICD-10-CM | POA: Diagnosis not present

## 2018-10-03 DIAGNOSIS — M25552 Pain in left hip: Secondary | ICD-10-CM | POA: Diagnosis not present

## 2018-10-03 DIAGNOSIS — G4700 Insomnia, unspecified: Secondary | ICD-10-CM | POA: Diagnosis not present

## 2018-10-03 DIAGNOSIS — R609 Edema, unspecified: Secondary | ICD-10-CM | POA: Diagnosis not present

## 2018-10-03 DIAGNOSIS — F329 Major depressive disorder, single episode, unspecified: Secondary | ICD-10-CM | POA: Diagnosis not present

## 2018-10-03 DIAGNOSIS — M545 Low back pain: Secondary | ICD-10-CM | POA: Diagnosis not present

## 2018-10-03 DIAGNOSIS — E876 Hypokalemia: Secondary | ICD-10-CM | POA: Diagnosis not present

## 2018-10-03 DIAGNOSIS — N183 Chronic kidney disease, stage 3 (moderate): Secondary | ICD-10-CM | POA: Diagnosis not present

## 2018-10-03 DIAGNOSIS — K219 Gastro-esophageal reflux disease without esophagitis: Secondary | ICD-10-CM | POA: Diagnosis not present

## 2018-10-03 DIAGNOSIS — E1140 Type 2 diabetes mellitus with diabetic neuropathy, unspecified: Secondary | ICD-10-CM | POA: Diagnosis not present

## 2018-12-14 DIAGNOSIS — G4700 Insomnia, unspecified: Secondary | ICD-10-CM | POA: Diagnosis not present

## 2018-12-14 DIAGNOSIS — N183 Chronic kidney disease, stage 3 (moderate): Secondary | ICD-10-CM | POA: Diagnosis not present

## 2018-12-14 DIAGNOSIS — J309 Allergic rhinitis, unspecified: Secondary | ICD-10-CM | POA: Diagnosis not present

## 2018-12-14 DIAGNOSIS — G43909 Migraine, unspecified, not intractable, without status migrainosus: Secondary | ICD-10-CM | POA: Diagnosis not present

## 2018-12-14 DIAGNOSIS — F329 Major depressive disorder, single episode, unspecified: Secondary | ICD-10-CM | POA: Diagnosis not present

## 2018-12-14 DIAGNOSIS — K581 Irritable bowel syndrome with constipation: Secondary | ICD-10-CM | POA: Diagnosis not present

## 2018-12-14 DIAGNOSIS — E7800 Pure hypercholesterolemia, unspecified: Secondary | ICD-10-CM | POA: Diagnosis not present

## 2018-12-14 DIAGNOSIS — F419 Anxiety disorder, unspecified: Secondary | ICD-10-CM | POA: Diagnosis not present

## 2018-12-14 DIAGNOSIS — R609 Edema, unspecified: Secondary | ICD-10-CM | POA: Diagnosis not present

## 2018-12-14 DIAGNOSIS — E1140 Type 2 diabetes mellitus with diabetic neuropathy, unspecified: Secondary | ICD-10-CM | POA: Diagnosis not present

## 2018-12-14 DIAGNOSIS — I10 Essential (primary) hypertension: Secondary | ICD-10-CM | POA: Diagnosis not present

## 2018-12-14 DIAGNOSIS — K219 Gastro-esophageal reflux disease without esophagitis: Secondary | ICD-10-CM | POA: Diagnosis not present

## 2018-12-14 DIAGNOSIS — E876 Hypokalemia: Secondary | ICD-10-CM | POA: Diagnosis not present

## 2018-12-14 DIAGNOSIS — M545 Low back pain: Secondary | ICD-10-CM | POA: Diagnosis not present

## 2018-12-14 DIAGNOSIS — E118 Type 2 diabetes mellitus with unspecified complications: Secondary | ICD-10-CM | POA: Diagnosis not present

## 2018-12-20 DIAGNOSIS — K219 Gastro-esophageal reflux disease without esophagitis: Secondary | ICD-10-CM | POA: Diagnosis not present

## 2018-12-20 DIAGNOSIS — E1140 Type 2 diabetes mellitus with diabetic neuropathy, unspecified: Secondary | ICD-10-CM | POA: Diagnosis not present

## 2018-12-20 DIAGNOSIS — M659 Synovitis and tenosynovitis, unspecified: Secondary | ICD-10-CM | POA: Diagnosis not present

## 2018-12-20 DIAGNOSIS — N183 Chronic kidney disease, stage 3 (moderate): Secondary | ICD-10-CM | POA: Diagnosis not present

## 2018-12-20 DIAGNOSIS — G4700 Insomnia, unspecified: Secondary | ICD-10-CM | POA: Diagnosis not present

## 2018-12-20 DIAGNOSIS — E876 Hypokalemia: Secondary | ICD-10-CM | POA: Diagnosis not present

## 2018-12-20 DIAGNOSIS — F419 Anxiety disorder, unspecified: Secondary | ICD-10-CM | POA: Diagnosis not present

## 2018-12-20 DIAGNOSIS — M545 Low back pain: Secondary | ICD-10-CM | POA: Diagnosis not present

## 2018-12-20 DIAGNOSIS — K581 Irritable bowel syndrome with constipation: Secondary | ICD-10-CM | POA: Diagnosis not present

## 2018-12-20 DIAGNOSIS — G43909 Migraine, unspecified, not intractable, without status migrainosus: Secondary | ICD-10-CM | POA: Diagnosis not present

## 2018-12-20 DIAGNOSIS — F329 Major depressive disorder, single episode, unspecified: Secondary | ICD-10-CM | POA: Diagnosis not present

## 2018-12-20 DIAGNOSIS — R609 Edema, unspecified: Secondary | ICD-10-CM | POA: Diagnosis not present

## 2018-12-27 DIAGNOSIS — F329 Major depressive disorder, single episode, unspecified: Secondary | ICD-10-CM | POA: Diagnosis not present

## 2018-12-27 DIAGNOSIS — G4700 Insomnia, unspecified: Secondary | ICD-10-CM | POA: Diagnosis not present

## 2018-12-27 DIAGNOSIS — K581 Irritable bowel syndrome with constipation: Secondary | ICD-10-CM | POA: Diagnosis not present

## 2018-12-27 DIAGNOSIS — R609 Edema, unspecified: Secondary | ICD-10-CM | POA: Diagnosis not present

## 2018-12-27 DIAGNOSIS — M545 Low back pain: Secondary | ICD-10-CM | POA: Diagnosis not present

## 2018-12-27 DIAGNOSIS — G43909 Migraine, unspecified, not intractable, without status migrainosus: Secondary | ICD-10-CM | POA: Diagnosis not present

## 2018-12-27 DIAGNOSIS — E876 Hypokalemia: Secondary | ICD-10-CM | POA: Diagnosis not present

## 2018-12-27 DIAGNOSIS — F419 Anxiety disorder, unspecified: Secondary | ICD-10-CM | POA: Diagnosis not present

## 2018-12-27 DIAGNOSIS — E1140 Type 2 diabetes mellitus with diabetic neuropathy, unspecified: Secondary | ICD-10-CM | POA: Diagnosis not present

## 2018-12-27 DIAGNOSIS — N183 Chronic kidney disease, stage 3 (moderate): Secondary | ICD-10-CM | POA: Diagnosis not present

## 2018-12-27 DIAGNOSIS — M109 Gout, unspecified: Secondary | ICD-10-CM | POA: Diagnosis not present

## 2018-12-27 DIAGNOSIS — K219 Gastro-esophageal reflux disease without esophagitis: Secondary | ICD-10-CM | POA: Diagnosis not present

## 2019-01-03 DIAGNOSIS — G43909 Migraine, unspecified, not intractable, without status migrainosus: Secondary | ICD-10-CM | POA: Diagnosis not present

## 2019-01-03 DIAGNOSIS — K219 Gastro-esophageal reflux disease without esophagitis: Secondary | ICD-10-CM | POA: Diagnosis not present

## 2019-01-03 DIAGNOSIS — E876 Hypokalemia: Secondary | ICD-10-CM | POA: Diagnosis not present

## 2019-01-03 DIAGNOSIS — N183 Chronic kidney disease, stage 3 (moderate): Secondary | ICD-10-CM | POA: Diagnosis not present

## 2019-01-03 DIAGNOSIS — K581 Irritable bowel syndrome with constipation: Secondary | ICD-10-CM | POA: Diagnosis not present

## 2019-01-03 DIAGNOSIS — M545 Low back pain: Secondary | ICD-10-CM | POA: Diagnosis not present

## 2019-01-03 DIAGNOSIS — E1140 Type 2 diabetes mellitus with diabetic neuropathy, unspecified: Secondary | ICD-10-CM | POA: Diagnosis not present

## 2019-01-03 DIAGNOSIS — R609 Edema, unspecified: Secondary | ICD-10-CM | POA: Diagnosis not present

## 2019-01-03 DIAGNOSIS — G4700 Insomnia, unspecified: Secondary | ICD-10-CM | POA: Diagnosis not present

## 2019-01-03 DIAGNOSIS — M659 Synovitis and tenosynovitis, unspecified: Secondary | ICD-10-CM | POA: Diagnosis not present

## 2019-01-03 DIAGNOSIS — F329 Major depressive disorder, single episode, unspecified: Secondary | ICD-10-CM | POA: Diagnosis not present

## 2019-01-03 DIAGNOSIS — F419 Anxiety disorder, unspecified: Secondary | ICD-10-CM | POA: Diagnosis not present

## 2019-02-01 DIAGNOSIS — H25813 Combined forms of age-related cataract, bilateral: Secondary | ICD-10-CM | POA: Diagnosis not present

## 2019-03-08 DIAGNOSIS — Z1231 Encounter for screening mammogram for malignant neoplasm of breast: Secondary | ICD-10-CM | POA: Diagnosis not present

## 2019-03-14 DIAGNOSIS — Z0000 Encounter for general adult medical examination without abnormal findings: Secondary | ICD-10-CM | POA: Diagnosis not present

## 2019-03-14 DIAGNOSIS — Z1331 Encounter for screening for depression: Secondary | ICD-10-CM | POA: Diagnosis not present

## 2019-03-14 DIAGNOSIS — E118 Type 2 diabetes mellitus with unspecified complications: Secondary | ICD-10-CM | POA: Diagnosis not present

## 2019-03-14 DIAGNOSIS — Z9181 History of falling: Secondary | ICD-10-CM | POA: Diagnosis not present

## 2019-03-14 DIAGNOSIS — Z139 Encounter for screening, unspecified: Secondary | ICD-10-CM | POA: Diagnosis not present

## 2019-03-14 DIAGNOSIS — K219 Gastro-esophageal reflux disease without esophagitis: Secondary | ICD-10-CM | POA: Diagnosis not present

## 2019-03-14 DIAGNOSIS — E1140 Type 2 diabetes mellitus with diabetic neuropathy, unspecified: Secondary | ICD-10-CM | POA: Diagnosis not present

## 2019-03-14 DIAGNOSIS — G4700 Insomnia, unspecified: Secondary | ICD-10-CM | POA: Diagnosis not present

## 2019-03-14 DIAGNOSIS — M545 Low back pain: Secondary | ICD-10-CM | POA: Diagnosis not present

## 2019-03-14 DIAGNOSIS — I10 Essential (primary) hypertension: Secondary | ICD-10-CM | POA: Diagnosis not present

## 2019-03-14 DIAGNOSIS — N183 Chronic kidney disease, stage 3 (moderate): Secondary | ICD-10-CM | POA: Diagnosis not present

## 2019-03-14 DIAGNOSIS — E785 Hyperlipidemia, unspecified: Secondary | ICD-10-CM | POA: Diagnosis not present

## 2019-03-14 DIAGNOSIS — M109 Gout, unspecified: Secondary | ICD-10-CM | POA: Diagnosis not present

## 2019-03-14 DIAGNOSIS — E876 Hypokalemia: Secondary | ICD-10-CM | POA: Diagnosis not present

## 2019-03-30 DIAGNOSIS — E1140 Type 2 diabetes mellitus with diabetic neuropathy, unspecified: Secondary | ICD-10-CM | POA: Diagnosis not present

## 2019-03-30 DIAGNOSIS — M545 Low back pain: Secondary | ICD-10-CM | POA: Diagnosis not present

## 2019-03-30 DIAGNOSIS — F329 Major depressive disorder, single episode, unspecified: Secondary | ICD-10-CM | POA: Diagnosis not present

## 2019-03-30 DIAGNOSIS — R609 Edema, unspecified: Secondary | ICD-10-CM | POA: Diagnosis not present

## 2019-03-30 DIAGNOSIS — N183 Chronic kidney disease, stage 3 (moderate): Secondary | ICD-10-CM | POA: Diagnosis not present

## 2019-03-30 DIAGNOSIS — G43909 Migraine, unspecified, not intractable, without status migrainosus: Secondary | ICD-10-CM | POA: Diagnosis not present

## 2019-03-30 DIAGNOSIS — M25552 Pain in left hip: Secondary | ICD-10-CM | POA: Diagnosis not present

## 2019-03-30 DIAGNOSIS — K219 Gastro-esophageal reflux disease without esophagitis: Secondary | ICD-10-CM | POA: Diagnosis not present

## 2019-03-30 DIAGNOSIS — M159 Polyosteoarthritis, unspecified: Secondary | ICD-10-CM | POA: Diagnosis not present

## 2019-03-30 DIAGNOSIS — E876 Hypokalemia: Secondary | ICD-10-CM | POA: Diagnosis not present

## 2019-03-30 DIAGNOSIS — G4700 Insomnia, unspecified: Secondary | ICD-10-CM | POA: Diagnosis not present

## 2019-03-30 DIAGNOSIS — K581 Irritable bowel syndrome with constipation: Secondary | ICD-10-CM | POA: Diagnosis not present

## 2019-04-27 DIAGNOSIS — Z01419 Encounter for gynecological examination (general) (routine) without abnormal findings: Secondary | ICD-10-CM | POA: Diagnosis not present

## 2019-05-08 DIAGNOSIS — G43909 Migraine, unspecified, not intractable, without status migrainosus: Secondary | ICD-10-CM | POA: Diagnosis not present

## 2019-05-08 DIAGNOSIS — Z23 Encounter for immunization: Secondary | ICD-10-CM | POA: Diagnosis not present

## 2019-05-08 DIAGNOSIS — E876 Hypokalemia: Secondary | ICD-10-CM | POA: Diagnosis not present

## 2019-05-08 DIAGNOSIS — M109 Gout, unspecified: Secondary | ICD-10-CM | POA: Diagnosis not present

## 2019-05-08 DIAGNOSIS — N390 Urinary tract infection, site not specified: Secondary | ICD-10-CM | POA: Diagnosis not present

## 2019-05-08 DIAGNOSIS — N183 Chronic kidney disease, stage 3 (moderate): Secondary | ICD-10-CM | POA: Diagnosis not present

## 2019-05-08 DIAGNOSIS — G4700 Insomnia, unspecified: Secondary | ICD-10-CM | POA: Diagnosis not present

## 2019-05-08 DIAGNOSIS — K581 Irritable bowel syndrome with constipation: Secondary | ICD-10-CM | POA: Diagnosis not present

## 2019-05-08 DIAGNOSIS — E1140 Type 2 diabetes mellitus with diabetic neuropathy, unspecified: Secondary | ICD-10-CM | POA: Diagnosis not present

## 2019-05-08 DIAGNOSIS — F329 Major depressive disorder, single episode, unspecified: Secondary | ICD-10-CM | POA: Diagnosis not present

## 2019-05-08 DIAGNOSIS — K219 Gastro-esophageal reflux disease without esophagitis: Secondary | ICD-10-CM | POA: Diagnosis not present

## 2019-05-11 DIAGNOSIS — G43909 Migraine, unspecified, not intractable, without status migrainosus: Secondary | ICD-10-CM | POA: Diagnosis not present

## 2019-05-11 DIAGNOSIS — E876 Hypokalemia: Secondary | ICD-10-CM | POA: Diagnosis not present

## 2019-05-11 DIAGNOSIS — G4700 Insomnia, unspecified: Secondary | ICD-10-CM | POA: Diagnosis not present

## 2019-05-11 DIAGNOSIS — K581 Irritable bowel syndrome with constipation: Secondary | ICD-10-CM | POA: Diagnosis not present

## 2019-05-11 DIAGNOSIS — K219 Gastro-esophageal reflux disease without esophagitis: Secondary | ICD-10-CM | POA: Diagnosis not present

## 2019-05-11 DIAGNOSIS — F329 Major depressive disorder, single episode, unspecified: Secondary | ICD-10-CM | POA: Diagnosis not present

## 2019-05-11 DIAGNOSIS — E1140 Type 2 diabetes mellitus with diabetic neuropathy, unspecified: Secondary | ICD-10-CM | POA: Diagnosis not present

## 2019-05-11 DIAGNOSIS — N183 Chronic kidney disease, stage 3 (moderate): Secondary | ICD-10-CM | POA: Diagnosis not present

## 2019-05-11 DIAGNOSIS — M109 Gout, unspecified: Secondary | ICD-10-CM | POA: Diagnosis not present

## 2019-05-11 DIAGNOSIS — R609 Edema, unspecified: Secondary | ICD-10-CM | POA: Diagnosis not present

## 2019-05-11 DIAGNOSIS — F419 Anxiety disorder, unspecified: Secondary | ICD-10-CM | POA: Diagnosis not present

## 2019-05-11 DIAGNOSIS — N390 Urinary tract infection, site not specified: Secondary | ICD-10-CM | POA: Diagnosis not present

## 2019-05-17 DIAGNOSIS — E876 Hypokalemia: Secondary | ICD-10-CM | POA: Diagnosis not present

## 2019-05-17 DIAGNOSIS — E1140 Type 2 diabetes mellitus with diabetic neuropathy, unspecified: Secondary | ICD-10-CM | POA: Diagnosis not present

## 2019-05-17 DIAGNOSIS — M109 Gout, unspecified: Secondary | ICD-10-CM | POA: Diagnosis not present

## 2019-05-17 DIAGNOSIS — G43909 Migraine, unspecified, not intractable, without status migrainosus: Secondary | ICD-10-CM | POA: Diagnosis not present

## 2019-05-17 DIAGNOSIS — K581 Irritable bowel syndrome with constipation: Secondary | ICD-10-CM | POA: Diagnosis not present

## 2019-05-17 DIAGNOSIS — N183 Chronic kidney disease, stage 3 (moderate): Secondary | ICD-10-CM | POA: Diagnosis not present

## 2019-05-17 DIAGNOSIS — K219 Gastro-esophageal reflux disease without esophagitis: Secondary | ICD-10-CM | POA: Diagnosis not present

## 2019-05-17 DIAGNOSIS — G4700 Insomnia, unspecified: Secondary | ICD-10-CM | POA: Diagnosis not present

## 2019-05-17 DIAGNOSIS — N390 Urinary tract infection, site not specified: Secondary | ICD-10-CM | POA: Diagnosis not present

## 2019-05-17 DIAGNOSIS — F329 Major depressive disorder, single episode, unspecified: Secondary | ICD-10-CM | POA: Diagnosis not present

## 2019-05-17 DIAGNOSIS — R3989 Other symptoms and signs involving the genitourinary system: Secondary | ICD-10-CM | POA: Diagnosis not present

## 2019-05-22 DIAGNOSIS — G43909 Migraine, unspecified, not intractable, without status migrainosus: Secondary | ICD-10-CM | POA: Diagnosis not present

## 2019-05-22 DIAGNOSIS — E1140 Type 2 diabetes mellitus with diabetic neuropathy, unspecified: Secondary | ICD-10-CM | POA: Diagnosis not present

## 2019-05-22 DIAGNOSIS — N183 Chronic kidney disease, stage 3 unspecified: Secondary | ICD-10-CM | POA: Diagnosis not present

## 2019-05-22 DIAGNOSIS — G4700 Insomnia, unspecified: Secondary | ICD-10-CM | POA: Diagnosis not present

## 2019-05-22 DIAGNOSIS — R609 Edema, unspecified: Secondary | ICD-10-CM | POA: Diagnosis not present

## 2019-05-22 DIAGNOSIS — E876 Hypokalemia: Secondary | ICD-10-CM | POA: Diagnosis not present

## 2019-05-22 DIAGNOSIS — N390 Urinary tract infection, site not specified: Secondary | ICD-10-CM | POA: Diagnosis not present

## 2019-05-22 DIAGNOSIS — K219 Gastro-esophageal reflux disease without esophagitis: Secondary | ICD-10-CM | POA: Diagnosis not present

## 2019-05-22 DIAGNOSIS — K581 Irritable bowel syndrome with constipation: Secondary | ICD-10-CM | POA: Diagnosis not present

## 2019-05-22 DIAGNOSIS — F329 Major depressive disorder, single episode, unspecified: Secondary | ICD-10-CM | POA: Diagnosis not present

## 2019-05-22 DIAGNOSIS — R3989 Other symptoms and signs involving the genitourinary system: Secondary | ICD-10-CM | POA: Diagnosis not present

## 2019-05-22 DIAGNOSIS — M109 Gout, unspecified: Secondary | ICD-10-CM | POA: Diagnosis not present

## 2019-05-30 DIAGNOSIS — Z79899 Other long term (current) drug therapy: Secondary | ICD-10-CM | POA: Diagnosis not present

## 2019-05-30 DIAGNOSIS — N390 Urinary tract infection, site not specified: Secondary | ICD-10-CM | POA: Diagnosis not present

## 2019-05-30 DIAGNOSIS — N952 Postmenopausal atrophic vaginitis: Secondary | ICD-10-CM | POA: Diagnosis not present

## 2019-05-30 DIAGNOSIS — B373 Candidiasis of vulva and vagina: Secondary | ICD-10-CM | POA: Diagnosis not present

## 2019-05-30 DIAGNOSIS — R3982 Chronic bladder pain: Secondary | ICD-10-CM | POA: Diagnosis not present

## 2019-06-01 DIAGNOSIS — B373 Candidiasis of vulva and vagina: Secondary | ICD-10-CM | POA: Diagnosis not present

## 2019-06-01 DIAGNOSIS — R3982 Chronic bladder pain: Secondary | ICD-10-CM | POA: Diagnosis not present

## 2019-06-01 DIAGNOSIS — R109 Unspecified abdominal pain: Secondary | ICD-10-CM | POA: Diagnosis not present

## 2019-06-01 DIAGNOSIS — N3289 Other specified disorders of bladder: Secondary | ICD-10-CM | POA: Diagnosis not present

## 2019-06-01 DIAGNOSIS — N390 Urinary tract infection, site not specified: Secondary | ICD-10-CM | POA: Diagnosis not present

## 2019-06-14 DIAGNOSIS — E876 Hypokalemia: Secondary | ICD-10-CM | POA: Diagnosis not present

## 2019-06-14 DIAGNOSIS — F3341 Major depressive disorder, recurrent, in partial remission: Secondary | ICD-10-CM | POA: Diagnosis not present

## 2019-06-14 DIAGNOSIS — G4700 Insomnia, unspecified: Secondary | ICD-10-CM | POA: Diagnosis not present

## 2019-06-14 DIAGNOSIS — E118 Type 2 diabetes mellitus with unspecified complications: Secondary | ICD-10-CM | POA: Diagnosis not present

## 2019-06-14 DIAGNOSIS — B373 Candidiasis of vulva and vagina: Secondary | ICD-10-CM | POA: Diagnosis not present

## 2019-06-14 DIAGNOSIS — E7800 Pure hypercholesterolemia, unspecified: Secondary | ICD-10-CM | POA: Diagnosis not present

## 2019-06-14 DIAGNOSIS — E1140 Type 2 diabetes mellitus with diabetic neuropathy, unspecified: Secondary | ICD-10-CM | POA: Diagnosis not present

## 2019-06-14 DIAGNOSIS — R3982 Chronic bladder pain: Secondary | ICD-10-CM | POA: Diagnosis not present

## 2019-06-14 DIAGNOSIS — K581 Irritable bowel syndrome with constipation: Secondary | ICD-10-CM | POA: Diagnosis not present

## 2019-06-14 DIAGNOSIS — N952 Postmenopausal atrophic vaginitis: Secondary | ICD-10-CM | POA: Diagnosis not present

## 2019-06-14 DIAGNOSIS — K219 Gastro-esophageal reflux disease without esophagitis: Secondary | ICD-10-CM | POA: Diagnosis not present

## 2019-06-14 DIAGNOSIS — M109 Gout, unspecified: Secondary | ICD-10-CM | POA: Diagnosis not present

## 2019-06-14 DIAGNOSIS — N390 Urinary tract infection, site not specified: Secondary | ICD-10-CM | POA: Diagnosis not present

## 2019-06-14 DIAGNOSIS — G43909 Migraine, unspecified, not intractable, without status migrainosus: Secondary | ICD-10-CM | POA: Diagnosis not present

## 2019-06-14 DIAGNOSIS — Z23 Encounter for immunization: Secondary | ICD-10-CM | POA: Diagnosis not present

## 2019-06-21 DIAGNOSIS — N183 Chronic kidney disease, stage 3 unspecified: Secondary | ICD-10-CM | POA: Diagnosis not present

## 2019-06-21 DIAGNOSIS — R5382 Chronic fatigue, unspecified: Secondary | ICD-10-CM | POA: Diagnosis not present

## 2019-06-21 DIAGNOSIS — K581 Irritable bowel syndrome with constipation: Secondary | ICD-10-CM | POA: Diagnosis not present

## 2019-06-21 DIAGNOSIS — E1140 Type 2 diabetes mellitus with diabetic neuropathy, unspecified: Secondary | ICD-10-CM | POA: Diagnosis not present

## 2019-06-21 DIAGNOSIS — G4700 Insomnia, unspecified: Secondary | ICD-10-CM | POA: Diagnosis not present

## 2019-06-21 DIAGNOSIS — M109 Gout, unspecified: Secondary | ICD-10-CM | POA: Diagnosis not present

## 2019-06-21 DIAGNOSIS — G43909 Migraine, unspecified, not intractable, without status migrainosus: Secondary | ICD-10-CM | POA: Diagnosis not present

## 2019-06-21 DIAGNOSIS — K219 Gastro-esophageal reflux disease without esophagitis: Secondary | ICD-10-CM | POA: Diagnosis not present

## 2019-06-21 DIAGNOSIS — N390 Urinary tract infection, site not specified: Secondary | ICD-10-CM | POA: Diagnosis not present

## 2019-06-21 DIAGNOSIS — E876 Hypokalemia: Secondary | ICD-10-CM | POA: Diagnosis not present

## 2019-06-21 DIAGNOSIS — R609 Edema, unspecified: Secondary | ICD-10-CM | POA: Diagnosis not present

## 2019-06-21 DIAGNOSIS — F3341 Major depressive disorder, recurrent, in partial remission: Secondary | ICD-10-CM | POA: Diagnosis not present

## 2019-07-05 DIAGNOSIS — G4700 Insomnia, unspecified: Secondary | ICD-10-CM | POA: Diagnosis not present

## 2019-07-05 DIAGNOSIS — E1140 Type 2 diabetes mellitus with diabetic neuropathy, unspecified: Secondary | ICD-10-CM | POA: Diagnosis not present

## 2019-07-05 DIAGNOSIS — E876 Hypokalemia: Secondary | ICD-10-CM | POA: Diagnosis not present

## 2019-07-05 DIAGNOSIS — J309 Allergic rhinitis, unspecified: Secondary | ICD-10-CM | POA: Diagnosis not present

## 2019-07-05 DIAGNOSIS — G43909 Migraine, unspecified, not intractable, without status migrainosus: Secondary | ICD-10-CM | POA: Diagnosis not present

## 2019-07-05 DIAGNOSIS — K219 Gastro-esophageal reflux disease without esophagitis: Secondary | ICD-10-CM | POA: Diagnosis not present

## 2019-07-05 DIAGNOSIS — M159 Polyosteoarthritis, unspecified: Secondary | ICD-10-CM | POA: Diagnosis not present

## 2019-07-05 DIAGNOSIS — K581 Irritable bowel syndrome with constipation: Secondary | ICD-10-CM | POA: Diagnosis not present

## 2019-07-05 DIAGNOSIS — R609 Edema, unspecified: Secondary | ICD-10-CM | POA: Diagnosis not present

## 2019-07-05 DIAGNOSIS — F3341 Major depressive disorder, recurrent, in partial remission: Secondary | ICD-10-CM | POA: Diagnosis not present

## 2019-07-05 DIAGNOSIS — N183 Chronic kidney disease, stage 3 unspecified: Secondary | ICD-10-CM | POA: Diagnosis not present

## 2019-07-05 DIAGNOSIS — M109 Gout, unspecified: Secondary | ICD-10-CM | POA: Diagnosis not present

## 2019-07-19 DIAGNOSIS — N390 Urinary tract infection, site not specified: Secondary | ICD-10-CM | POA: Diagnosis not present

## 2019-07-19 DIAGNOSIS — M1830 Unilateral post-traumatic osteoarthritis of first carpometacarpal joint, unspecified hand: Secondary | ICD-10-CM | POA: Diagnosis not present

## 2019-07-19 DIAGNOSIS — K581 Irritable bowel syndrome with constipation: Secondary | ICD-10-CM | POA: Diagnosis not present

## 2019-07-19 DIAGNOSIS — E876 Hypokalemia: Secondary | ICD-10-CM | POA: Diagnosis not present

## 2019-07-19 DIAGNOSIS — E1140 Type 2 diabetes mellitus with diabetic neuropathy, unspecified: Secondary | ICD-10-CM | POA: Diagnosis not present

## 2019-07-19 DIAGNOSIS — K219 Gastro-esophageal reflux disease without esophagitis: Secondary | ICD-10-CM | POA: Diagnosis not present

## 2019-07-19 DIAGNOSIS — J309 Allergic rhinitis, unspecified: Secondary | ICD-10-CM | POA: Diagnosis not present

## 2019-07-19 DIAGNOSIS — R5382 Chronic fatigue, unspecified: Secondary | ICD-10-CM | POA: Diagnosis not present

## 2019-07-19 DIAGNOSIS — G43909 Migraine, unspecified, not intractable, without status migrainosus: Secondary | ICD-10-CM | POA: Diagnosis not present

## 2019-07-19 DIAGNOSIS — R609 Edema, unspecified: Secondary | ICD-10-CM | POA: Diagnosis not present

## 2019-07-19 DIAGNOSIS — M109 Gout, unspecified: Secondary | ICD-10-CM | POA: Diagnosis not present

## 2019-07-19 DIAGNOSIS — F3341 Major depressive disorder, recurrent, in partial remission: Secondary | ICD-10-CM | POA: Diagnosis not present

## 2019-07-19 DIAGNOSIS — M159 Polyosteoarthritis, unspecified: Secondary | ICD-10-CM | POA: Diagnosis not present

## 2019-07-26 DIAGNOSIS — M159 Polyosteoarthritis, unspecified: Secondary | ICD-10-CM | POA: Diagnosis not present

## 2019-07-26 DIAGNOSIS — N183 Chronic kidney disease, stage 3 unspecified: Secondary | ICD-10-CM | POA: Diagnosis not present

## 2019-07-26 DIAGNOSIS — E876 Hypokalemia: Secondary | ICD-10-CM | POA: Diagnosis not present

## 2019-07-26 DIAGNOSIS — K581 Irritable bowel syndrome with constipation: Secondary | ICD-10-CM | POA: Diagnosis not present

## 2019-07-26 DIAGNOSIS — F3341 Major depressive disorder, recurrent, in partial remission: Secondary | ICD-10-CM | POA: Diagnosis not present

## 2019-07-26 DIAGNOSIS — N390 Urinary tract infection, site not specified: Secondary | ICD-10-CM | POA: Diagnosis not present

## 2019-07-26 DIAGNOSIS — E1140 Type 2 diabetes mellitus with diabetic neuropathy, unspecified: Secondary | ICD-10-CM | POA: Diagnosis not present

## 2019-07-26 DIAGNOSIS — G43909 Migraine, unspecified, not intractable, without status migrainosus: Secondary | ICD-10-CM | POA: Diagnosis not present

## 2019-07-26 DIAGNOSIS — R609 Edema, unspecified: Secondary | ICD-10-CM | POA: Diagnosis not present

## 2019-07-26 DIAGNOSIS — Z6830 Body mass index (BMI) 30.0-30.9, adult: Secondary | ICD-10-CM | POA: Diagnosis not present

## 2019-07-26 DIAGNOSIS — E663 Overweight: Secondary | ICD-10-CM | POA: Diagnosis not present

## 2019-07-26 DIAGNOSIS — M109 Gout, unspecified: Secondary | ICD-10-CM | POA: Diagnosis not present

## 2019-07-31 DIAGNOSIS — N3289 Other specified disorders of bladder: Secondary | ICD-10-CM | POA: Diagnosis not present

## 2019-07-31 DIAGNOSIS — N390 Urinary tract infection, site not specified: Secondary | ICD-10-CM | POA: Diagnosis not present

## 2019-07-31 DIAGNOSIS — N952 Postmenopausal atrophic vaginitis: Secondary | ICD-10-CM | POA: Diagnosis not present

## 2019-08-02 DIAGNOSIS — K581 Irritable bowel syndrome with constipation: Secondary | ICD-10-CM | POA: Diagnosis not present

## 2019-08-02 DIAGNOSIS — M109 Gout, unspecified: Secondary | ICD-10-CM | POA: Diagnosis not present

## 2019-08-02 DIAGNOSIS — F3341 Major depressive disorder, recurrent, in partial remission: Secondary | ICD-10-CM | POA: Diagnosis not present

## 2019-08-02 DIAGNOSIS — G43909 Migraine, unspecified, not intractable, without status migrainosus: Secondary | ICD-10-CM | POA: Diagnosis not present

## 2019-08-02 DIAGNOSIS — E876 Hypokalemia: Secondary | ICD-10-CM | POA: Diagnosis not present

## 2019-08-02 DIAGNOSIS — R300 Dysuria: Secondary | ICD-10-CM | POA: Diagnosis not present

## 2019-08-02 DIAGNOSIS — R609 Edema, unspecified: Secondary | ICD-10-CM | POA: Diagnosis not present

## 2019-08-02 DIAGNOSIS — I10 Essential (primary) hypertension: Secondary | ICD-10-CM | POA: Diagnosis not present

## 2019-08-02 DIAGNOSIS — E1140 Type 2 diabetes mellitus with diabetic neuropathy, unspecified: Secondary | ICD-10-CM | POA: Diagnosis not present

## 2019-08-02 DIAGNOSIS — M159 Polyosteoarthritis, unspecified: Secondary | ICD-10-CM | POA: Diagnosis not present

## 2019-08-02 DIAGNOSIS — N183 Chronic kidney disease, stage 3 unspecified: Secondary | ICD-10-CM | POA: Diagnosis not present

## 2019-08-10 DIAGNOSIS — N183 Chronic kidney disease, stage 3 unspecified: Secondary | ICD-10-CM | POA: Diagnosis not present

## 2019-08-10 DIAGNOSIS — K219 Gastro-esophageal reflux disease without esophagitis: Secondary | ICD-10-CM | POA: Diagnosis not present

## 2019-08-10 DIAGNOSIS — G43909 Migraine, unspecified, not intractable, without status migrainosus: Secondary | ICD-10-CM | POA: Diagnosis not present

## 2019-08-10 DIAGNOSIS — J309 Allergic rhinitis, unspecified: Secondary | ICD-10-CM | POA: Diagnosis not present

## 2019-08-10 DIAGNOSIS — E876 Hypokalemia: Secondary | ICD-10-CM | POA: Diagnosis not present

## 2019-08-10 DIAGNOSIS — M159 Polyosteoarthritis, unspecified: Secondary | ICD-10-CM | POA: Diagnosis not present

## 2019-08-10 DIAGNOSIS — F3341 Major depressive disorder, recurrent, in partial remission: Secondary | ICD-10-CM | POA: Diagnosis not present

## 2019-08-10 DIAGNOSIS — E1140 Type 2 diabetes mellitus with diabetic neuropathy, unspecified: Secondary | ICD-10-CM | POA: Diagnosis not present

## 2019-08-10 DIAGNOSIS — N3281 Overactive bladder: Secondary | ICD-10-CM | POA: Diagnosis not present

## 2019-08-10 DIAGNOSIS — M109 Gout, unspecified: Secondary | ICD-10-CM | POA: Diagnosis not present

## 2019-08-10 DIAGNOSIS — K581 Irritable bowel syndrome with constipation: Secondary | ICD-10-CM | POA: Diagnosis not present

## 2019-08-10 DIAGNOSIS — R609 Edema, unspecified: Secondary | ICD-10-CM | POA: Diagnosis not present

## 2019-08-15 DIAGNOSIS — N318 Other neuromuscular dysfunction of bladder: Secondary | ICD-10-CM | POA: Diagnosis not present

## 2019-08-15 DIAGNOSIS — N3281 Overactive bladder: Secondary | ICD-10-CM | POA: Diagnosis not present

## 2019-08-15 DIAGNOSIS — N3000 Acute cystitis without hematuria: Secondary | ICD-10-CM | POA: Diagnosis not present

## 2019-08-24 DIAGNOSIS — K581 Irritable bowel syndrome with constipation: Secondary | ICD-10-CM | POA: Diagnosis not present

## 2019-08-24 DIAGNOSIS — N3281 Overactive bladder: Secondary | ICD-10-CM | POA: Diagnosis not present

## 2019-08-24 DIAGNOSIS — E876 Hypokalemia: Secondary | ICD-10-CM | POA: Diagnosis not present

## 2019-08-24 DIAGNOSIS — I10 Essential (primary) hypertension: Secondary | ICD-10-CM | POA: Diagnosis not present

## 2019-08-24 DIAGNOSIS — E1140 Type 2 diabetes mellitus with diabetic neuropathy, unspecified: Secondary | ICD-10-CM | POA: Diagnosis not present

## 2019-08-24 DIAGNOSIS — K219 Gastro-esophageal reflux disease without esophagitis: Secondary | ICD-10-CM | POA: Diagnosis not present

## 2019-08-24 DIAGNOSIS — F419 Anxiety disorder, unspecified: Secondary | ICD-10-CM | POA: Diagnosis not present

## 2019-08-24 DIAGNOSIS — N183 Chronic kidney disease, stage 3 unspecified: Secondary | ICD-10-CM | POA: Diagnosis not present

## 2019-08-24 DIAGNOSIS — M109 Gout, unspecified: Secondary | ICD-10-CM | POA: Diagnosis not present

## 2019-08-24 DIAGNOSIS — N390 Urinary tract infection, site not specified: Secondary | ICD-10-CM | POA: Diagnosis not present

## 2019-08-24 DIAGNOSIS — G43909 Migraine, unspecified, not intractable, without status migrainosus: Secondary | ICD-10-CM | POA: Diagnosis not present

## 2019-08-24 DIAGNOSIS — F3341 Major depressive disorder, recurrent, in partial remission: Secondary | ICD-10-CM | POA: Diagnosis not present

## 2019-09-05 DIAGNOSIS — S0990XA Unspecified injury of head, initial encounter: Secondary | ICD-10-CM | POA: Diagnosis not present

## 2019-09-05 DIAGNOSIS — S2232XA Fracture of one rib, left side, initial encounter for closed fracture: Secondary | ICD-10-CM | POA: Diagnosis not present

## 2019-09-05 DIAGNOSIS — R4020 Unspecified coma: Secondary | ICD-10-CM | POA: Diagnosis not present

## 2019-09-05 DIAGNOSIS — S2242XA Multiple fractures of ribs, left side, initial encounter for closed fracture: Secondary | ICD-10-CM | POA: Diagnosis not present

## 2019-09-05 DIAGNOSIS — R404 Transient alteration of awareness: Secondary | ICD-10-CM | POA: Diagnosis not present

## 2019-09-05 DIAGNOSIS — S8992XA Unspecified injury of left lower leg, initial encounter: Secondary | ICD-10-CM | POA: Diagnosis not present

## 2019-09-05 DIAGNOSIS — S3991XA Unspecified injury of abdomen, initial encounter: Secondary | ICD-10-CM | POA: Diagnosis not present

## 2019-09-05 DIAGNOSIS — S81812A Laceration without foreign body, left lower leg, initial encounter: Secondary | ICD-10-CM | POA: Diagnosis not present

## 2019-09-07 DIAGNOSIS — Z6829 Body mass index (BMI) 29.0-29.9, adult: Secondary | ICD-10-CM | POA: Diagnosis not present

## 2019-09-07 DIAGNOSIS — R55 Syncope and collapse: Secondary | ICD-10-CM | POA: Diagnosis not present

## 2019-09-07 DIAGNOSIS — E876 Hypokalemia: Secondary | ICD-10-CM | POA: Diagnosis not present

## 2019-09-07 DIAGNOSIS — K219 Gastro-esophageal reflux disease without esophagitis: Secondary | ICD-10-CM | POA: Diagnosis not present

## 2019-09-07 DIAGNOSIS — R5382 Chronic fatigue, unspecified: Secondary | ICD-10-CM | POA: Diagnosis not present

## 2019-09-07 DIAGNOSIS — F3341 Major depressive disorder, recurrent, in partial remission: Secondary | ICD-10-CM | POA: Diagnosis not present

## 2019-09-07 DIAGNOSIS — E663 Overweight: Secondary | ICD-10-CM | POA: Diagnosis not present

## 2019-09-07 DIAGNOSIS — F419 Anxiety disorder, unspecified: Secondary | ICD-10-CM | POA: Diagnosis not present

## 2019-09-07 DIAGNOSIS — M109 Gout, unspecified: Secondary | ICD-10-CM | POA: Diagnosis not present

## 2019-09-07 DIAGNOSIS — E1140 Type 2 diabetes mellitus with diabetic neuropathy, unspecified: Secondary | ICD-10-CM | POA: Diagnosis not present

## 2019-09-07 DIAGNOSIS — I10 Essential (primary) hypertension: Secondary | ICD-10-CM | POA: Diagnosis not present

## 2019-09-14 DIAGNOSIS — E7800 Pure hypercholesterolemia, unspecified: Secondary | ICD-10-CM | POA: Diagnosis not present

## 2019-09-14 DIAGNOSIS — G43909 Migraine, unspecified, not intractable, without status migrainosus: Secondary | ICD-10-CM | POA: Diagnosis not present

## 2019-09-14 DIAGNOSIS — K581 Irritable bowel syndrome with constipation: Secondary | ICD-10-CM | POA: Diagnosis not present

## 2019-09-14 DIAGNOSIS — F3341 Major depressive disorder, recurrent, in partial remission: Secondary | ICD-10-CM | POA: Diagnosis not present

## 2019-09-14 DIAGNOSIS — R55 Syncope and collapse: Secondary | ICD-10-CM | POA: Diagnosis not present

## 2019-09-14 DIAGNOSIS — K219 Gastro-esophageal reflux disease without esophagitis: Secondary | ICD-10-CM | POA: Diagnosis not present

## 2019-09-14 DIAGNOSIS — M109 Gout, unspecified: Secondary | ICD-10-CM | POA: Diagnosis not present

## 2019-09-14 DIAGNOSIS — E1140 Type 2 diabetes mellitus with diabetic neuropathy, unspecified: Secondary | ICD-10-CM | POA: Diagnosis not present

## 2019-09-14 DIAGNOSIS — I10 Essential (primary) hypertension: Secondary | ICD-10-CM | POA: Diagnosis not present

## 2019-09-14 DIAGNOSIS — E876 Hypokalemia: Secondary | ICD-10-CM | POA: Diagnosis not present

## 2019-09-14 DIAGNOSIS — E118 Type 2 diabetes mellitus with unspecified complications: Secondary | ICD-10-CM | POA: Diagnosis not present

## 2019-09-14 DIAGNOSIS — F419 Anxiety disorder, unspecified: Secondary | ICD-10-CM | POA: Diagnosis not present

## 2019-09-14 DIAGNOSIS — N3281 Overactive bladder: Secondary | ICD-10-CM | POA: Diagnosis not present

## 2019-09-18 DIAGNOSIS — R55 Syncope and collapse: Secondary | ICD-10-CM | POA: Diagnosis not present

## 2019-10-13 DIAGNOSIS — R55 Syncope and collapse: Secondary | ICD-10-CM | POA: Diagnosis not present

## 2019-10-27 DIAGNOSIS — G43909 Migraine, unspecified, not intractable, without status migrainosus: Secondary | ICD-10-CM | POA: Diagnosis not present

## 2019-10-27 DIAGNOSIS — E1140 Type 2 diabetes mellitus with diabetic neuropathy, unspecified: Secondary | ICD-10-CM | POA: Diagnosis not present

## 2019-10-27 DIAGNOSIS — N3281 Overactive bladder: Secondary | ICD-10-CM | POA: Diagnosis not present

## 2019-10-27 DIAGNOSIS — E876 Hypokalemia: Secondary | ICD-10-CM | POA: Diagnosis not present

## 2019-10-27 DIAGNOSIS — K581 Irritable bowel syndrome with constipation: Secondary | ICD-10-CM | POA: Diagnosis not present

## 2019-10-27 DIAGNOSIS — M109 Gout, unspecified: Secondary | ICD-10-CM | POA: Diagnosis not present

## 2019-10-27 DIAGNOSIS — R55 Syncope and collapse: Secondary | ICD-10-CM | POA: Diagnosis not present

## 2019-10-27 DIAGNOSIS — N390 Urinary tract infection, site not specified: Secondary | ICD-10-CM | POA: Diagnosis not present

## 2019-10-27 DIAGNOSIS — F3341 Major depressive disorder, recurrent, in partial remission: Secondary | ICD-10-CM | POA: Diagnosis not present

## 2019-10-27 DIAGNOSIS — K219 Gastro-esophageal reflux disease without esophagitis: Secondary | ICD-10-CM | POA: Diagnosis not present

## 2019-10-27 DIAGNOSIS — N183 Chronic kidney disease, stage 3 unspecified: Secondary | ICD-10-CM | POA: Diagnosis not present

## 2019-11-03 DIAGNOSIS — E1140 Type 2 diabetes mellitus with diabetic neuropathy, unspecified: Secondary | ICD-10-CM | POA: Diagnosis not present

## 2019-11-03 DIAGNOSIS — F419 Anxiety disorder, unspecified: Secondary | ICD-10-CM | POA: Diagnosis not present

## 2019-11-03 DIAGNOSIS — N183 Chronic kidney disease, stage 3 unspecified: Secondary | ICD-10-CM | POA: Diagnosis not present

## 2019-11-03 DIAGNOSIS — K591 Functional diarrhea: Secondary | ICD-10-CM | POA: Diagnosis not present

## 2019-11-03 DIAGNOSIS — F3341 Major depressive disorder, recurrent, in partial remission: Secondary | ICD-10-CM | POA: Diagnosis not present

## 2019-11-03 DIAGNOSIS — N3281 Overactive bladder: Secondary | ICD-10-CM | POA: Diagnosis not present

## 2019-11-03 DIAGNOSIS — N390 Urinary tract infection, site not specified: Secondary | ICD-10-CM | POA: Diagnosis not present

## 2019-11-03 DIAGNOSIS — E876 Hypokalemia: Secondary | ICD-10-CM | POA: Diagnosis not present

## 2019-11-03 DIAGNOSIS — G43909 Migraine, unspecified, not intractable, without status migrainosus: Secondary | ICD-10-CM | POA: Diagnosis not present

## 2019-11-03 DIAGNOSIS — K219 Gastro-esophageal reflux disease without esophagitis: Secondary | ICD-10-CM | POA: Diagnosis not present

## 2019-11-03 DIAGNOSIS — R55 Syncope and collapse: Secondary | ICD-10-CM | POA: Diagnosis not present

## 2019-11-03 DIAGNOSIS — M109 Gout, unspecified: Secondary | ICD-10-CM | POA: Diagnosis not present

## 2019-11-10 DIAGNOSIS — F3341 Major depressive disorder, recurrent, in partial remission: Secondary | ICD-10-CM | POA: Diagnosis not present

## 2019-11-10 DIAGNOSIS — K581 Irritable bowel syndrome with constipation: Secondary | ICD-10-CM | POA: Diagnosis not present

## 2019-11-10 DIAGNOSIS — E663 Overweight: Secondary | ICD-10-CM | POA: Diagnosis not present

## 2019-11-10 DIAGNOSIS — K219 Gastro-esophageal reflux disease without esophagitis: Secondary | ICD-10-CM | POA: Diagnosis not present

## 2019-11-10 DIAGNOSIS — E1140 Type 2 diabetes mellitus with diabetic neuropathy, unspecified: Secondary | ICD-10-CM | POA: Diagnosis not present

## 2019-11-10 DIAGNOSIS — R55 Syncope and collapse: Secondary | ICD-10-CM | POA: Diagnosis not present

## 2019-11-10 DIAGNOSIS — G43909 Migraine, unspecified, not intractable, without status migrainosus: Secondary | ICD-10-CM | POA: Diagnosis not present

## 2019-11-10 DIAGNOSIS — R300 Dysuria: Secondary | ICD-10-CM | POA: Diagnosis not present

## 2019-11-10 DIAGNOSIS — M109 Gout, unspecified: Secondary | ICD-10-CM | POA: Diagnosis not present

## 2019-11-10 DIAGNOSIS — E876 Hypokalemia: Secondary | ICD-10-CM | POA: Diagnosis not present

## 2019-11-10 DIAGNOSIS — N3281 Overactive bladder: Secondary | ICD-10-CM | POA: Diagnosis not present

## 2019-11-10 DIAGNOSIS — F419 Anxiety disorder, unspecified: Secondary | ICD-10-CM | POA: Diagnosis not present

## 2019-11-23 DIAGNOSIS — G43909 Migraine, unspecified, not intractable, without status migrainosus: Secondary | ICD-10-CM | POA: Diagnosis not present

## 2019-11-23 DIAGNOSIS — R609 Edema, unspecified: Secondary | ICD-10-CM | POA: Diagnosis not present

## 2019-11-23 DIAGNOSIS — M109 Gout, unspecified: Secondary | ICD-10-CM | POA: Diagnosis not present

## 2019-11-23 DIAGNOSIS — E876 Hypokalemia: Secondary | ICD-10-CM | POA: Diagnosis not present

## 2019-11-23 DIAGNOSIS — N3281 Overactive bladder: Secondary | ICD-10-CM | POA: Diagnosis not present

## 2019-11-23 DIAGNOSIS — N183 Chronic kidney disease, stage 3 unspecified: Secondary | ICD-10-CM | POA: Diagnosis not present

## 2019-11-23 DIAGNOSIS — K581 Irritable bowel syndrome with constipation: Secondary | ICD-10-CM | POA: Diagnosis not present

## 2019-11-23 DIAGNOSIS — F3341 Major depressive disorder, recurrent, in partial remission: Secondary | ICD-10-CM | POA: Diagnosis not present

## 2019-11-23 DIAGNOSIS — R55 Syncope and collapse: Secondary | ICD-10-CM | POA: Diagnosis not present

## 2019-11-23 DIAGNOSIS — K219 Gastro-esophageal reflux disease without esophagitis: Secondary | ICD-10-CM | POA: Diagnosis not present

## 2019-11-23 DIAGNOSIS — F419 Anxiety disorder, unspecified: Secondary | ICD-10-CM | POA: Diagnosis not present

## 2019-11-23 DIAGNOSIS — E1140 Type 2 diabetes mellitus with diabetic neuropathy, unspecified: Secondary | ICD-10-CM | POA: Diagnosis not present

## 2019-12-04 DIAGNOSIS — M109 Gout, unspecified: Secondary | ICD-10-CM | POA: Diagnosis not present

## 2019-12-04 DIAGNOSIS — F419 Anxiety disorder, unspecified: Secondary | ICD-10-CM | POA: Diagnosis not present

## 2019-12-04 DIAGNOSIS — K219 Gastro-esophageal reflux disease without esophagitis: Secondary | ICD-10-CM | POA: Diagnosis not present

## 2019-12-04 DIAGNOSIS — M659 Synovitis and tenosynovitis, unspecified: Secondary | ICD-10-CM | POA: Diagnosis not present

## 2019-12-04 DIAGNOSIS — E1140 Type 2 diabetes mellitus with diabetic neuropathy, unspecified: Secondary | ICD-10-CM | POA: Diagnosis not present

## 2019-12-04 DIAGNOSIS — N183 Chronic kidney disease, stage 3 unspecified: Secondary | ICD-10-CM | POA: Diagnosis not present

## 2019-12-04 DIAGNOSIS — E876 Hypokalemia: Secondary | ICD-10-CM | POA: Diagnosis not present

## 2019-12-04 DIAGNOSIS — F3341 Major depressive disorder, recurrent, in partial remission: Secondary | ICD-10-CM | POA: Diagnosis not present

## 2019-12-04 DIAGNOSIS — R55 Syncope and collapse: Secondary | ICD-10-CM | POA: Diagnosis not present

## 2019-12-04 DIAGNOSIS — K581 Irritable bowel syndrome with constipation: Secondary | ICD-10-CM | POA: Diagnosis not present

## 2019-12-04 DIAGNOSIS — R609 Edema, unspecified: Secondary | ICD-10-CM | POA: Diagnosis not present

## 2019-12-04 DIAGNOSIS — G43909 Migraine, unspecified, not intractable, without status migrainosus: Secondary | ICD-10-CM | POA: Diagnosis not present

## 2019-12-13 DIAGNOSIS — N3281 Overactive bladder: Secondary | ICD-10-CM | POA: Diagnosis not present

## 2019-12-13 DIAGNOSIS — E7800 Pure hypercholesterolemia, unspecified: Secondary | ICD-10-CM | POA: Diagnosis not present

## 2019-12-13 DIAGNOSIS — E118 Type 2 diabetes mellitus with unspecified complications: Secondary | ICD-10-CM | POA: Diagnosis not present

## 2019-12-13 DIAGNOSIS — E1140 Type 2 diabetes mellitus with diabetic neuropathy, unspecified: Secondary | ICD-10-CM | POA: Diagnosis not present

## 2019-12-13 DIAGNOSIS — M109 Gout, unspecified: Secondary | ICD-10-CM | POA: Diagnosis not present

## 2019-12-13 DIAGNOSIS — R55 Syncope and collapse: Secondary | ICD-10-CM | POA: Diagnosis not present

## 2019-12-13 DIAGNOSIS — K219 Gastro-esophageal reflux disease without esophagitis: Secondary | ICD-10-CM | POA: Diagnosis not present

## 2019-12-13 DIAGNOSIS — I10 Essential (primary) hypertension: Secondary | ICD-10-CM | POA: Diagnosis not present

## 2019-12-13 DIAGNOSIS — F419 Anxiety disorder, unspecified: Secondary | ICD-10-CM | POA: Diagnosis not present

## 2019-12-13 DIAGNOSIS — E876 Hypokalemia: Secondary | ICD-10-CM | POA: Diagnosis not present

## 2019-12-13 DIAGNOSIS — F3341 Major depressive disorder, recurrent, in partial remission: Secondary | ICD-10-CM | POA: Diagnosis not present

## 2019-12-13 DIAGNOSIS — N390 Urinary tract infection, site not specified: Secondary | ICD-10-CM | POA: Diagnosis not present

## 2019-12-22 DIAGNOSIS — E1140 Type 2 diabetes mellitus with diabetic neuropathy, unspecified: Secondary | ICD-10-CM | POA: Diagnosis not present

## 2019-12-22 DIAGNOSIS — G43909 Migraine, unspecified, not intractable, without status migrainosus: Secondary | ICD-10-CM | POA: Diagnosis not present

## 2019-12-22 DIAGNOSIS — M109 Gout, unspecified: Secondary | ICD-10-CM | POA: Diagnosis not present

## 2019-12-22 DIAGNOSIS — F419 Anxiety disorder, unspecified: Secondary | ICD-10-CM | POA: Diagnosis not present

## 2019-12-22 DIAGNOSIS — F3341 Major depressive disorder, recurrent, in partial remission: Secondary | ICD-10-CM | POA: Diagnosis not present

## 2019-12-22 DIAGNOSIS — M659 Synovitis and tenosynovitis, unspecified: Secondary | ICD-10-CM | POA: Diagnosis not present

## 2019-12-22 DIAGNOSIS — E663 Overweight: Secondary | ICD-10-CM | POA: Diagnosis not present

## 2019-12-22 DIAGNOSIS — K219 Gastro-esophageal reflux disease without esophagitis: Secondary | ICD-10-CM | POA: Diagnosis not present

## 2019-12-22 DIAGNOSIS — E876 Hypokalemia: Secondary | ICD-10-CM | POA: Diagnosis not present

## 2019-12-22 DIAGNOSIS — Z6828 Body mass index (BMI) 28.0-28.9, adult: Secondary | ICD-10-CM | POA: Diagnosis not present

## 2019-12-22 DIAGNOSIS — K581 Irritable bowel syndrome with constipation: Secondary | ICD-10-CM | POA: Diagnosis not present

## 2019-12-22 DIAGNOSIS — R55 Syncope and collapse: Secondary | ICD-10-CM | POA: Diagnosis not present

## 2019-12-29 DIAGNOSIS — R55 Syncope and collapse: Secondary | ICD-10-CM | POA: Diagnosis not present

## 2019-12-29 DIAGNOSIS — E876 Hypokalemia: Secondary | ICD-10-CM | POA: Diagnosis not present

## 2019-12-29 DIAGNOSIS — G43909 Migraine, unspecified, not intractable, without status migrainosus: Secondary | ICD-10-CM | POA: Diagnosis not present

## 2019-12-29 DIAGNOSIS — E663 Overweight: Secondary | ICD-10-CM | POA: Diagnosis not present

## 2019-12-29 DIAGNOSIS — M109 Gout, unspecified: Secondary | ICD-10-CM | POA: Diagnosis not present

## 2019-12-29 DIAGNOSIS — K581 Irritable bowel syndrome with constipation: Secondary | ICD-10-CM | POA: Diagnosis not present

## 2019-12-29 DIAGNOSIS — M659 Synovitis and tenosynovitis, unspecified: Secondary | ICD-10-CM | POA: Diagnosis not present

## 2019-12-29 DIAGNOSIS — F419 Anxiety disorder, unspecified: Secondary | ICD-10-CM | POA: Diagnosis not present

## 2019-12-29 DIAGNOSIS — Z6828 Body mass index (BMI) 28.0-28.9, adult: Secondary | ICD-10-CM | POA: Diagnosis not present

## 2019-12-29 DIAGNOSIS — F3341 Major depressive disorder, recurrent, in partial remission: Secondary | ICD-10-CM | POA: Diagnosis not present

## 2019-12-29 DIAGNOSIS — E1140 Type 2 diabetes mellitus with diabetic neuropathy, unspecified: Secondary | ICD-10-CM | POA: Diagnosis not present

## 2019-12-29 DIAGNOSIS — K219 Gastro-esophageal reflux disease without esophagitis: Secondary | ICD-10-CM | POA: Diagnosis not present

## 2020-01-12 DIAGNOSIS — I10 Essential (primary) hypertension: Secondary | ICD-10-CM | POA: Diagnosis not present

## 2020-01-12 DIAGNOSIS — E1140 Type 2 diabetes mellitus with diabetic neuropathy, unspecified: Secondary | ICD-10-CM | POA: Diagnosis not present

## 2020-01-12 DIAGNOSIS — E663 Overweight: Secondary | ICD-10-CM | POA: Diagnosis not present

## 2020-01-12 DIAGNOSIS — F419 Anxiety disorder, unspecified: Secondary | ICD-10-CM | POA: Diagnosis not present

## 2020-01-12 DIAGNOSIS — Z6828 Body mass index (BMI) 28.0-28.9, adult: Secondary | ICD-10-CM | POA: Diagnosis not present

## 2020-01-12 DIAGNOSIS — M109 Gout, unspecified: Secondary | ICD-10-CM | POA: Diagnosis not present

## 2020-01-12 DIAGNOSIS — K219 Gastro-esophageal reflux disease without esophagitis: Secondary | ICD-10-CM | POA: Diagnosis not present

## 2020-01-12 DIAGNOSIS — R55 Syncope and collapse: Secondary | ICD-10-CM | POA: Diagnosis not present

## 2020-01-12 DIAGNOSIS — E876 Hypokalemia: Secondary | ICD-10-CM | POA: Diagnosis not present

## 2020-02-09 DIAGNOSIS — N183 Chronic kidney disease, stage 3 unspecified: Secondary | ICD-10-CM | POA: Diagnosis not present

## 2020-02-09 DIAGNOSIS — F3341 Major depressive disorder, recurrent, in partial remission: Secondary | ICD-10-CM | POA: Diagnosis not present

## 2020-02-09 DIAGNOSIS — E876 Hypokalemia: Secondary | ICD-10-CM | POA: Diagnosis not present

## 2020-02-09 DIAGNOSIS — R55 Syncope and collapse: Secondary | ICD-10-CM | POA: Diagnosis not present

## 2020-02-09 DIAGNOSIS — G43909 Migraine, unspecified, not intractable, without status migrainosus: Secondary | ICD-10-CM | POA: Diagnosis not present

## 2020-02-09 DIAGNOSIS — E1140 Type 2 diabetes mellitus with diabetic neuropathy, unspecified: Secondary | ICD-10-CM | POA: Diagnosis not present

## 2020-02-09 DIAGNOSIS — R609 Edema, unspecified: Secondary | ICD-10-CM | POA: Diagnosis not present

## 2020-02-09 DIAGNOSIS — F419 Anxiety disorder, unspecified: Secondary | ICD-10-CM | POA: Diagnosis not present

## 2020-02-09 DIAGNOSIS — I10 Essential (primary) hypertension: Secondary | ICD-10-CM | POA: Diagnosis not present

## 2020-02-09 DIAGNOSIS — M109 Gout, unspecified: Secondary | ICD-10-CM | POA: Diagnosis not present

## 2020-02-09 DIAGNOSIS — K581 Irritable bowel syndrome with constipation: Secondary | ICD-10-CM | POA: Diagnosis not present

## 2020-02-09 DIAGNOSIS — K219 Gastro-esophageal reflux disease without esophagitis: Secondary | ICD-10-CM | POA: Diagnosis not present

## 2020-02-21 DIAGNOSIS — E876 Hypokalemia: Secondary | ICD-10-CM | POA: Diagnosis not present

## 2020-02-21 DIAGNOSIS — K219 Gastro-esophageal reflux disease without esophagitis: Secondary | ICD-10-CM | POA: Diagnosis not present

## 2020-02-21 DIAGNOSIS — N183 Chronic kidney disease, stage 3 unspecified: Secondary | ICD-10-CM | POA: Diagnosis not present

## 2020-02-21 DIAGNOSIS — G43909 Migraine, unspecified, not intractable, without status migrainosus: Secondary | ICD-10-CM | POA: Diagnosis not present

## 2020-02-21 DIAGNOSIS — E1140 Type 2 diabetes mellitus with diabetic neuropathy, unspecified: Secondary | ICD-10-CM | POA: Diagnosis not present

## 2020-02-21 DIAGNOSIS — R55 Syncope and collapse: Secondary | ICD-10-CM | POA: Diagnosis not present

## 2020-02-21 DIAGNOSIS — I10 Essential (primary) hypertension: Secondary | ICD-10-CM | POA: Diagnosis not present

## 2020-02-21 DIAGNOSIS — F3341 Major depressive disorder, recurrent, in partial remission: Secondary | ICD-10-CM | POA: Diagnosis not present

## 2020-02-21 DIAGNOSIS — N390 Urinary tract infection, site not specified: Secondary | ICD-10-CM | POA: Diagnosis not present

## 2020-02-21 DIAGNOSIS — F419 Anxiety disorder, unspecified: Secondary | ICD-10-CM | POA: Diagnosis not present

## 2020-02-21 DIAGNOSIS — M109 Gout, unspecified: Secondary | ICD-10-CM | POA: Diagnosis not present

## 2020-02-21 DIAGNOSIS — K581 Irritable bowel syndrome with constipation: Secondary | ICD-10-CM | POA: Diagnosis not present

## 2020-03-14 DIAGNOSIS — R55 Syncope and collapse: Secondary | ICD-10-CM | POA: Diagnosis not present

## 2020-03-14 DIAGNOSIS — E118 Type 2 diabetes mellitus with unspecified complications: Secondary | ICD-10-CM | POA: Diagnosis not present

## 2020-03-14 DIAGNOSIS — F3341 Major depressive disorder, recurrent, in partial remission: Secondary | ICD-10-CM | POA: Diagnosis not present

## 2020-03-14 DIAGNOSIS — I10 Essential (primary) hypertension: Secondary | ICD-10-CM | POA: Diagnosis not present

## 2020-03-14 DIAGNOSIS — K581 Irritable bowel syndrome with constipation: Secondary | ICD-10-CM | POA: Diagnosis not present

## 2020-03-14 DIAGNOSIS — M25552 Pain in left hip: Secondary | ICD-10-CM | POA: Diagnosis not present

## 2020-03-14 DIAGNOSIS — G43909 Migraine, unspecified, not intractable, without status migrainosus: Secondary | ICD-10-CM | POA: Diagnosis not present

## 2020-03-14 DIAGNOSIS — K219 Gastro-esophageal reflux disease without esophagitis: Secondary | ICD-10-CM | POA: Diagnosis not present

## 2020-03-14 DIAGNOSIS — N390 Urinary tract infection, site not specified: Secondary | ICD-10-CM | POA: Diagnosis not present

## 2020-03-14 DIAGNOSIS — M109 Gout, unspecified: Secondary | ICD-10-CM | POA: Diagnosis not present

## 2020-03-14 DIAGNOSIS — E876 Hypokalemia: Secondary | ICD-10-CM | POA: Diagnosis not present

## 2020-03-14 DIAGNOSIS — F419 Anxiety disorder, unspecified: Secondary | ICD-10-CM | POA: Diagnosis not present

## 2020-03-14 DIAGNOSIS — E1140 Type 2 diabetes mellitus with diabetic neuropathy, unspecified: Secondary | ICD-10-CM | POA: Diagnosis not present

## 2020-03-20 DIAGNOSIS — Z1231 Encounter for screening mammogram for malignant neoplasm of breast: Secondary | ICD-10-CM | POA: Diagnosis not present

## 2020-03-25 ENCOUNTER — Other Ambulatory Visit: Payer: Self-pay | Admitting: Obstetrics

## 2020-03-25 DIAGNOSIS — R928 Other abnormal and inconclusive findings on diagnostic imaging of breast: Secondary | ICD-10-CM

## 2020-04-01 DIAGNOSIS — E876 Hypokalemia: Secondary | ICD-10-CM | POA: Diagnosis not present

## 2020-04-01 DIAGNOSIS — N183 Chronic kidney disease, stage 3 unspecified: Secondary | ICD-10-CM | POA: Diagnosis not present

## 2020-04-01 DIAGNOSIS — N3281 Overactive bladder: Secondary | ICD-10-CM | POA: Diagnosis not present

## 2020-04-01 DIAGNOSIS — K219 Gastro-esophageal reflux disease without esophagitis: Secondary | ICD-10-CM | POA: Diagnosis not present

## 2020-04-01 DIAGNOSIS — F3341 Major depressive disorder, recurrent, in partial remission: Secondary | ICD-10-CM | POA: Diagnosis not present

## 2020-04-01 DIAGNOSIS — R55 Syncope and collapse: Secondary | ICD-10-CM | POA: Diagnosis not present

## 2020-04-01 DIAGNOSIS — M109 Gout, unspecified: Secondary | ICD-10-CM | POA: Diagnosis not present

## 2020-04-01 DIAGNOSIS — R300 Dysuria: Secondary | ICD-10-CM | POA: Diagnosis not present

## 2020-04-01 DIAGNOSIS — F419 Anxiety disorder, unspecified: Secondary | ICD-10-CM | POA: Diagnosis not present

## 2020-04-01 DIAGNOSIS — G43909 Migraine, unspecified, not intractable, without status migrainosus: Secondary | ICD-10-CM | POA: Diagnosis not present

## 2020-04-01 DIAGNOSIS — E1140 Type 2 diabetes mellitus with diabetic neuropathy, unspecified: Secondary | ICD-10-CM | POA: Diagnosis not present

## 2020-04-01 DIAGNOSIS — K581 Irritable bowel syndrome with constipation: Secondary | ICD-10-CM | POA: Diagnosis not present

## 2020-04-08 ENCOUNTER — Other Ambulatory Visit: Payer: Self-pay | Admitting: Obstetrics

## 2020-04-08 ENCOUNTER — Ambulatory Visit
Admission: RE | Admit: 2020-04-08 | Discharge: 2020-04-08 | Disposition: A | Discharge status: Home / Self Care | Payer: Self-pay | Source: Ambulatory Visit | Attending: Obstetrics | Admitting: Obstetrics

## 2020-04-08 ENCOUNTER — Other Ambulatory Visit: Payer: Self-pay

## 2020-04-08 DIAGNOSIS — R928 Other abnormal and inconclusive findings on diagnostic imaging of breast: Secondary | ICD-10-CM

## 2020-04-08 DIAGNOSIS — N6341 Unspecified lump in right breast, subareolar: Secondary | ICD-10-CM | POA: Diagnosis not present

## 2020-04-09 DIAGNOSIS — K219 Gastro-esophageal reflux disease without esophagitis: Secondary | ICD-10-CM | POA: Diagnosis not present

## 2020-04-09 DIAGNOSIS — E876 Hypokalemia: Secondary | ICD-10-CM | POA: Diagnosis not present

## 2020-04-09 DIAGNOSIS — E1140 Type 2 diabetes mellitus with diabetic neuropathy, unspecified: Secondary | ICD-10-CM | POA: Diagnosis not present

## 2020-04-09 DIAGNOSIS — F419 Anxiety disorder, unspecified: Secondary | ICD-10-CM | POA: Diagnosis not present

## 2020-04-09 DIAGNOSIS — N3281 Overactive bladder: Secondary | ICD-10-CM | POA: Diagnosis not present

## 2020-04-09 DIAGNOSIS — R300 Dysuria: Secondary | ICD-10-CM | POA: Diagnosis not present

## 2020-04-09 DIAGNOSIS — G43909 Migraine, unspecified, not intractable, without status migrainosus: Secondary | ICD-10-CM | POA: Diagnosis not present

## 2020-04-09 DIAGNOSIS — F3341 Major depressive disorder, recurrent, in partial remission: Secondary | ICD-10-CM | POA: Diagnosis not present

## 2020-04-09 DIAGNOSIS — N183 Chronic kidney disease, stage 3 unspecified: Secondary | ICD-10-CM | POA: Diagnosis not present

## 2020-04-09 DIAGNOSIS — R55 Syncope and collapse: Secondary | ICD-10-CM | POA: Diagnosis not present

## 2020-04-09 DIAGNOSIS — M109 Gout, unspecified: Secondary | ICD-10-CM | POA: Diagnosis not present

## 2020-04-09 DIAGNOSIS — K581 Irritable bowel syndrome with constipation: Secondary | ICD-10-CM | POA: Diagnosis not present

## 2020-04-16 ENCOUNTER — Other Ambulatory Visit: Payer: Self-pay

## 2020-04-16 ENCOUNTER — Ambulatory Visit
Admission: RE | Admit: 2020-04-16 | Discharge: 2020-04-16 | Disposition: A | Discharge status: Home / Self Care | Payer: Self-pay | Source: Ambulatory Visit | Attending: Obstetrics | Admitting: Obstetrics

## 2020-04-16 DIAGNOSIS — R928 Other abnormal and inconclusive findings on diagnostic imaging of breast: Secondary | ICD-10-CM

## 2020-04-16 DIAGNOSIS — N6341 Unspecified lump in right breast, subareolar: Secondary | ICD-10-CM | POA: Diagnosis not present

## 2020-04-16 DIAGNOSIS — N6312 Unspecified lump in the right breast, upper inner quadrant: Secondary | ICD-10-CM | POA: Diagnosis not present

## 2020-04-29 DIAGNOSIS — K219 Gastro-esophageal reflux disease without esophagitis: Secondary | ICD-10-CM | POA: Diagnosis not present

## 2020-04-29 DIAGNOSIS — Z9181 History of falling: Secondary | ICD-10-CM | POA: Diagnosis not present

## 2020-04-29 DIAGNOSIS — R55 Syncope and collapse: Secondary | ICD-10-CM | POA: Diagnosis not present

## 2020-04-29 DIAGNOSIS — M109 Gout, unspecified: Secondary | ICD-10-CM | POA: Diagnosis not present

## 2020-04-29 DIAGNOSIS — K581 Irritable bowel syndrome with constipation: Secondary | ICD-10-CM | POA: Diagnosis not present

## 2020-04-29 DIAGNOSIS — G43909 Migraine, unspecified, not intractable, without status migrainosus: Secondary | ICD-10-CM | POA: Diagnosis not present

## 2020-04-29 DIAGNOSIS — E1140 Type 2 diabetes mellitus with diabetic neuropathy, unspecified: Secondary | ICD-10-CM | POA: Diagnosis not present

## 2020-04-29 DIAGNOSIS — Z139 Encounter for screening, unspecified: Secondary | ICD-10-CM | POA: Diagnosis not present

## 2020-04-29 DIAGNOSIS — N3281 Overactive bladder: Secondary | ICD-10-CM | POA: Diagnosis not present

## 2020-04-29 DIAGNOSIS — E876 Hypokalemia: Secondary | ICD-10-CM | POA: Diagnosis not present

## 2020-04-29 DIAGNOSIS — F3341 Major depressive disorder, recurrent, in partial remission: Secondary | ICD-10-CM | POA: Diagnosis not present

## 2020-04-29 DIAGNOSIS — F419 Anxiety disorder, unspecified: Secondary | ICD-10-CM | POA: Diagnosis not present

## 2020-05-15 DIAGNOSIS — K219 Gastro-esophageal reflux disease without esophagitis: Secondary | ICD-10-CM | POA: Diagnosis not present

## 2020-05-15 DIAGNOSIS — E876 Hypokalemia: Secondary | ICD-10-CM | POA: Diagnosis not present

## 2020-05-15 DIAGNOSIS — E1140 Type 2 diabetes mellitus with diabetic neuropathy, unspecified: Secondary | ICD-10-CM | POA: Diagnosis not present

## 2020-05-15 DIAGNOSIS — Z9181 History of falling: Secondary | ICD-10-CM | POA: Diagnosis not present

## 2020-05-15 DIAGNOSIS — I10 Essential (primary) hypertension: Secondary | ICD-10-CM | POA: Diagnosis not present

## 2020-05-15 DIAGNOSIS — Z139 Encounter for screening, unspecified: Secondary | ICD-10-CM | POA: Diagnosis not present

## 2020-05-15 DIAGNOSIS — K581 Irritable bowel syndrome with constipation: Secondary | ICD-10-CM | POA: Diagnosis not present

## 2020-05-15 DIAGNOSIS — F3341 Major depressive disorder, recurrent, in partial remission: Secondary | ICD-10-CM | POA: Diagnosis not present

## 2020-05-15 DIAGNOSIS — F419 Anxiety disorder, unspecified: Secondary | ICD-10-CM | POA: Diagnosis not present

## 2020-05-15 DIAGNOSIS — M109 Gout, unspecified: Secondary | ICD-10-CM | POA: Diagnosis not present

## 2020-05-15 DIAGNOSIS — G43909 Migraine, unspecified, not intractable, without status migrainosus: Secondary | ICD-10-CM | POA: Diagnosis not present

## 2020-05-15 DIAGNOSIS — R55 Syncope and collapse: Secondary | ICD-10-CM | POA: Diagnosis not present

## 2020-05-15 DIAGNOSIS — N183 Chronic kidney disease, stage 3 unspecified: Secondary | ICD-10-CM | POA: Diagnosis not present

## 2020-05-15 DIAGNOSIS — E7800 Pure hypercholesterolemia, unspecified: Secondary | ICD-10-CM | POA: Diagnosis not present

## 2020-05-31 DIAGNOSIS — E785 Hyperlipidemia, unspecified: Secondary | ICD-10-CM | POA: Diagnosis not present

## 2020-05-31 DIAGNOSIS — Z9181 History of falling: Secondary | ICD-10-CM | POA: Diagnosis not present

## 2020-05-31 DIAGNOSIS — Z1331 Encounter for screening for depression: Secondary | ICD-10-CM | POA: Diagnosis not present

## 2020-05-31 DIAGNOSIS — E669 Obesity, unspecified: Secondary | ICD-10-CM | POA: Diagnosis not present

## 2020-05-31 DIAGNOSIS — Z0000 Encounter for general adult medical examination without abnormal findings: Secondary | ICD-10-CM | POA: Diagnosis not present

## 2020-06-14 DIAGNOSIS — F3341 Major depressive disorder, recurrent, in partial remission: Secondary | ICD-10-CM | POA: Diagnosis not present

## 2020-06-14 DIAGNOSIS — E1140 Type 2 diabetes mellitus with diabetic neuropathy, unspecified: Secondary | ICD-10-CM | POA: Diagnosis not present

## 2020-06-14 DIAGNOSIS — K581 Irritable bowel syndrome with constipation: Secondary | ICD-10-CM | POA: Diagnosis not present

## 2020-06-14 DIAGNOSIS — R55 Syncope and collapse: Secondary | ICD-10-CM | POA: Diagnosis not present

## 2020-06-14 DIAGNOSIS — E118 Type 2 diabetes mellitus with unspecified complications: Secondary | ICD-10-CM | POA: Diagnosis not present

## 2020-06-14 DIAGNOSIS — G43909 Migraine, unspecified, not intractable, without status migrainosus: Secondary | ICD-10-CM | POA: Diagnosis not present

## 2020-06-14 DIAGNOSIS — Z23 Encounter for immunization: Secondary | ICD-10-CM | POA: Diagnosis not present

## 2020-06-14 DIAGNOSIS — R609 Edema, unspecified: Secondary | ICD-10-CM | POA: Diagnosis not present

## 2020-06-14 DIAGNOSIS — M109 Gout, unspecified: Secondary | ICD-10-CM | POA: Diagnosis not present

## 2020-06-14 DIAGNOSIS — E876 Hypokalemia: Secondary | ICD-10-CM | POA: Diagnosis not present

## 2020-06-14 DIAGNOSIS — K219 Gastro-esophageal reflux disease without esophagitis: Secondary | ICD-10-CM | POA: Diagnosis not present

## 2020-06-14 DIAGNOSIS — N183 Chronic kidney disease, stage 3 unspecified: Secondary | ICD-10-CM | POA: Diagnosis not present

## 2020-06-14 DIAGNOSIS — F419 Anxiety disorder, unspecified: Secondary | ICD-10-CM | POA: Diagnosis not present

## 2020-06-19 DIAGNOSIS — F3341 Major depressive disorder, recurrent, in partial remission: Secondary | ICD-10-CM | POA: Diagnosis not present

## 2020-06-19 DIAGNOSIS — E1140 Type 2 diabetes mellitus with diabetic neuropathy, unspecified: Secondary | ICD-10-CM | POA: Diagnosis not present

## 2020-06-19 DIAGNOSIS — K581 Irritable bowel syndrome with constipation: Secondary | ICD-10-CM | POA: Diagnosis not present

## 2020-06-19 DIAGNOSIS — R609 Edema, unspecified: Secondary | ICD-10-CM | POA: Diagnosis not present

## 2020-06-19 DIAGNOSIS — K219 Gastro-esophageal reflux disease without esophagitis: Secondary | ICD-10-CM | POA: Diagnosis not present

## 2020-06-19 DIAGNOSIS — R300 Dysuria: Secondary | ICD-10-CM | POA: Diagnosis not present

## 2020-06-19 DIAGNOSIS — E876 Hypokalemia: Secondary | ICD-10-CM | POA: Diagnosis not present

## 2020-06-19 DIAGNOSIS — M109 Gout, unspecified: Secondary | ICD-10-CM | POA: Diagnosis not present

## 2020-06-19 DIAGNOSIS — J309 Allergic rhinitis, unspecified: Secondary | ICD-10-CM | POA: Diagnosis not present

## 2020-06-19 DIAGNOSIS — R55 Syncope and collapse: Secondary | ICD-10-CM | POA: Diagnosis not present

## 2020-06-19 DIAGNOSIS — G43909 Migraine, unspecified, not intractable, without status migrainosus: Secondary | ICD-10-CM | POA: Diagnosis not present

## 2020-06-19 DIAGNOSIS — N183 Chronic kidney disease, stage 3 unspecified: Secondary | ICD-10-CM | POA: Diagnosis not present

## 2020-07-12 DIAGNOSIS — R609 Edema, unspecified: Secondary | ICD-10-CM | POA: Diagnosis not present

## 2020-07-12 DIAGNOSIS — N183 Chronic kidney disease, stage 3 unspecified: Secondary | ICD-10-CM | POA: Diagnosis not present

## 2020-07-12 DIAGNOSIS — M109 Gout, unspecified: Secondary | ICD-10-CM | POA: Diagnosis not present

## 2020-07-12 DIAGNOSIS — E1140 Type 2 diabetes mellitus with diabetic neuropathy, unspecified: Secondary | ICD-10-CM | POA: Diagnosis not present

## 2020-07-12 DIAGNOSIS — E876 Hypokalemia: Secondary | ICD-10-CM | POA: Diagnosis not present

## 2020-07-12 DIAGNOSIS — G43909 Migraine, unspecified, not intractable, without status migrainosus: Secondary | ICD-10-CM | POA: Diagnosis not present

## 2020-07-12 DIAGNOSIS — D649 Anemia, unspecified: Secondary | ICD-10-CM | POA: Diagnosis not present

## 2020-07-12 DIAGNOSIS — R55 Syncope and collapse: Secondary | ICD-10-CM | POA: Diagnosis not present

## 2020-07-12 DIAGNOSIS — F3341 Major depressive disorder, recurrent, in partial remission: Secondary | ICD-10-CM | POA: Diagnosis not present

## 2020-07-12 DIAGNOSIS — K219 Gastro-esophageal reflux disease without esophagitis: Secondary | ICD-10-CM | POA: Diagnosis not present

## 2020-07-12 DIAGNOSIS — K581 Irritable bowel syndrome with constipation: Secondary | ICD-10-CM | POA: Diagnosis not present

## 2020-07-12 DIAGNOSIS — J309 Allergic rhinitis, unspecified: Secondary | ICD-10-CM | POA: Diagnosis not present

## 2020-07-24 DIAGNOSIS — K581 Irritable bowel syndrome with constipation: Secondary | ICD-10-CM | POA: Diagnosis not present

## 2020-07-24 DIAGNOSIS — I10 Essential (primary) hypertension: Secondary | ICD-10-CM | POA: Diagnosis not present

## 2020-07-24 DIAGNOSIS — E876 Hypokalemia: Secondary | ICD-10-CM | POA: Diagnosis not present

## 2020-07-24 DIAGNOSIS — G43909 Migraine, unspecified, not intractable, without status migrainosus: Secondary | ICD-10-CM | POA: Diagnosis not present

## 2020-07-24 DIAGNOSIS — N183 Chronic kidney disease, stage 3 unspecified: Secondary | ICD-10-CM | POA: Diagnosis not present

## 2020-07-24 DIAGNOSIS — E7800 Pure hypercholesterolemia, unspecified: Secondary | ICD-10-CM | POA: Diagnosis not present

## 2020-07-24 DIAGNOSIS — M109 Gout, unspecified: Secondary | ICD-10-CM | POA: Diagnosis not present

## 2020-07-24 DIAGNOSIS — F3341 Major depressive disorder, recurrent, in partial remission: Secondary | ICD-10-CM | POA: Diagnosis not present

## 2020-07-24 DIAGNOSIS — R55 Syncope and collapse: Secondary | ICD-10-CM | POA: Diagnosis not present

## 2020-07-24 DIAGNOSIS — E1140 Type 2 diabetes mellitus with diabetic neuropathy, unspecified: Secondary | ICD-10-CM | POA: Diagnosis not present

## 2020-07-24 DIAGNOSIS — E118 Type 2 diabetes mellitus with unspecified complications: Secondary | ICD-10-CM | POA: Diagnosis not present

## 2020-07-24 DIAGNOSIS — K219 Gastro-esophageal reflux disease without esophagitis: Secondary | ICD-10-CM | POA: Diagnosis not present

## 2020-08-08 DIAGNOSIS — Z20822 Contact with and (suspected) exposure to covid-19: Secondary | ICD-10-CM | POA: Diagnosis not present

## 2020-08-14 DIAGNOSIS — E1140 Type 2 diabetes mellitus with diabetic neuropathy, unspecified: Secondary | ICD-10-CM | POA: Diagnosis not present

## 2020-08-14 DIAGNOSIS — K219 Gastro-esophageal reflux disease without esophagitis: Secondary | ICD-10-CM | POA: Diagnosis not present

## 2020-08-14 DIAGNOSIS — N183 Chronic kidney disease, stage 3 unspecified: Secondary | ICD-10-CM | POA: Diagnosis not present

## 2020-08-14 DIAGNOSIS — E876 Hypokalemia: Secondary | ICD-10-CM | POA: Diagnosis not present

## 2020-08-14 DIAGNOSIS — F419 Anxiety disorder, unspecified: Secondary | ICD-10-CM | POA: Diagnosis not present

## 2020-08-14 DIAGNOSIS — M659 Synovitis and tenosynovitis, unspecified: Secondary | ICD-10-CM | POA: Diagnosis not present

## 2020-08-14 DIAGNOSIS — Z6828 Body mass index (BMI) 28.0-28.9, adult: Secondary | ICD-10-CM | POA: Diagnosis not present

## 2020-08-14 DIAGNOSIS — K581 Irritable bowel syndrome with constipation: Secondary | ICD-10-CM | POA: Diagnosis not present

## 2020-08-14 DIAGNOSIS — F3341 Major depressive disorder, recurrent, in partial remission: Secondary | ICD-10-CM | POA: Diagnosis not present

## 2020-08-14 DIAGNOSIS — R55 Syncope and collapse: Secondary | ICD-10-CM | POA: Diagnosis not present

## 2020-08-14 DIAGNOSIS — G43909 Migraine, unspecified, not intractable, without status migrainosus: Secondary | ICD-10-CM | POA: Diagnosis not present

## 2020-08-14 DIAGNOSIS — M109 Gout, unspecified: Secondary | ICD-10-CM | POA: Diagnosis not present

## 2020-08-23 DIAGNOSIS — F419 Anxiety disorder, unspecified: Secondary | ICD-10-CM | POA: Diagnosis not present

## 2020-08-23 DIAGNOSIS — R55 Syncope and collapse: Secondary | ICD-10-CM | POA: Diagnosis not present

## 2020-08-23 DIAGNOSIS — G4700 Insomnia, unspecified: Secondary | ICD-10-CM | POA: Diagnosis not present

## 2020-08-23 DIAGNOSIS — G43909 Migraine, unspecified, not intractable, without status migrainosus: Secondary | ICD-10-CM | POA: Diagnosis not present

## 2020-08-23 DIAGNOSIS — E1140 Type 2 diabetes mellitus with diabetic neuropathy, unspecified: Secondary | ICD-10-CM | POA: Diagnosis not present

## 2020-08-23 DIAGNOSIS — M109 Gout, unspecified: Secondary | ICD-10-CM | POA: Diagnosis not present

## 2020-08-23 DIAGNOSIS — E876 Hypokalemia: Secondary | ICD-10-CM | POA: Diagnosis not present

## 2020-08-23 DIAGNOSIS — K581 Irritable bowel syndrome with constipation: Secondary | ICD-10-CM | POA: Diagnosis not present

## 2020-08-23 DIAGNOSIS — K219 Gastro-esophageal reflux disease without esophagitis: Secondary | ICD-10-CM | POA: Diagnosis not present

## 2020-08-23 DIAGNOSIS — M659 Synovitis and tenosynovitis, unspecified: Secondary | ICD-10-CM | POA: Diagnosis not present

## 2020-08-23 DIAGNOSIS — F3341 Major depressive disorder, recurrent, in partial remission: Secondary | ICD-10-CM | POA: Diagnosis not present

## 2020-08-23 DIAGNOSIS — N390 Urinary tract infection, site not specified: Secondary | ICD-10-CM | POA: Diagnosis not present

## 2020-09-04 DIAGNOSIS — R55 Syncope and collapse: Secondary | ICD-10-CM | POA: Diagnosis not present

## 2020-09-04 DIAGNOSIS — R609 Edema, unspecified: Secondary | ICD-10-CM | POA: Diagnosis not present

## 2020-09-04 DIAGNOSIS — K219 Gastro-esophageal reflux disease without esophagitis: Secondary | ICD-10-CM | POA: Diagnosis not present

## 2020-09-04 DIAGNOSIS — G43909 Migraine, unspecified, not intractable, without status migrainosus: Secondary | ICD-10-CM | POA: Diagnosis not present

## 2020-09-04 DIAGNOSIS — F3341 Major depressive disorder, recurrent, in partial remission: Secondary | ICD-10-CM | POA: Diagnosis not present

## 2020-09-04 DIAGNOSIS — F419 Anxiety disorder, unspecified: Secondary | ICD-10-CM | POA: Diagnosis not present

## 2020-09-04 DIAGNOSIS — K581 Irritable bowel syndrome with constipation: Secondary | ICD-10-CM | POA: Diagnosis not present

## 2020-09-04 DIAGNOSIS — M109 Gout, unspecified: Secondary | ICD-10-CM | POA: Diagnosis not present

## 2020-09-04 DIAGNOSIS — N390 Urinary tract infection, site not specified: Secondary | ICD-10-CM | POA: Diagnosis not present

## 2020-09-04 DIAGNOSIS — N183 Chronic kidney disease, stage 3 unspecified: Secondary | ICD-10-CM | POA: Diagnosis not present

## 2020-09-04 DIAGNOSIS — E876 Hypokalemia: Secondary | ICD-10-CM | POA: Diagnosis not present

## 2020-09-04 DIAGNOSIS — E1140 Type 2 diabetes mellitus with diabetic neuropathy, unspecified: Secondary | ICD-10-CM | POA: Diagnosis not present

## 2020-09-13 DIAGNOSIS — E118 Type 2 diabetes mellitus with unspecified complications: Secondary | ICD-10-CM | POA: Diagnosis not present

## 2020-09-13 DIAGNOSIS — E1140 Type 2 diabetes mellitus with diabetic neuropathy, unspecified: Secondary | ICD-10-CM | POA: Diagnosis not present

## 2020-09-13 DIAGNOSIS — M109 Gout, unspecified: Secondary | ICD-10-CM | POA: Diagnosis not present

## 2020-09-13 DIAGNOSIS — K581 Irritable bowel syndrome with constipation: Secondary | ICD-10-CM | POA: Diagnosis not present

## 2020-09-13 DIAGNOSIS — F419 Anxiety disorder, unspecified: Secondary | ICD-10-CM | POA: Diagnosis not present

## 2020-09-13 DIAGNOSIS — F3341 Major depressive disorder, recurrent, in partial remission: Secondary | ICD-10-CM | POA: Diagnosis not present

## 2020-09-13 DIAGNOSIS — E876 Hypokalemia: Secondary | ICD-10-CM | POA: Diagnosis not present

## 2020-09-13 DIAGNOSIS — I10 Essential (primary) hypertension: Secondary | ICD-10-CM | POA: Diagnosis not present

## 2020-09-13 DIAGNOSIS — R609 Edema, unspecified: Secondary | ICD-10-CM | POA: Diagnosis not present

## 2020-09-13 DIAGNOSIS — N183 Chronic kidney disease, stage 3 unspecified: Secondary | ICD-10-CM | POA: Diagnosis not present

## 2020-09-13 DIAGNOSIS — K219 Gastro-esophageal reflux disease without esophagitis: Secondary | ICD-10-CM | POA: Diagnosis not present

## 2020-09-13 DIAGNOSIS — G43909 Migraine, unspecified, not intractable, without status migrainosus: Secondary | ICD-10-CM | POA: Diagnosis not present

## 2020-09-13 DIAGNOSIS — R55 Syncope and collapse: Secondary | ICD-10-CM | POA: Diagnosis not present

## 2020-09-20 DIAGNOSIS — E876 Hypokalemia: Secondary | ICD-10-CM | POA: Diagnosis not present

## 2020-09-20 DIAGNOSIS — R609 Edema, unspecified: Secondary | ICD-10-CM | POA: Diagnosis not present

## 2020-09-20 DIAGNOSIS — M109 Gout, unspecified: Secondary | ICD-10-CM | POA: Diagnosis not present

## 2020-09-20 DIAGNOSIS — G43909 Migraine, unspecified, not intractable, without status migrainosus: Secondary | ICD-10-CM | POA: Diagnosis not present

## 2020-09-20 DIAGNOSIS — K219 Gastro-esophageal reflux disease without esophagitis: Secondary | ICD-10-CM | POA: Diagnosis not present

## 2020-09-20 DIAGNOSIS — R55 Syncope and collapse: Secondary | ICD-10-CM | POA: Diagnosis not present

## 2020-09-20 DIAGNOSIS — F419 Anxiety disorder, unspecified: Secondary | ICD-10-CM | POA: Diagnosis not present

## 2020-09-20 DIAGNOSIS — N183 Chronic kidney disease, stage 3 unspecified: Secondary | ICD-10-CM | POA: Diagnosis not present

## 2020-09-20 DIAGNOSIS — K581 Irritable bowel syndrome with constipation: Secondary | ICD-10-CM | POA: Diagnosis not present

## 2020-09-20 DIAGNOSIS — E1140 Type 2 diabetes mellitus with diabetic neuropathy, unspecified: Secondary | ICD-10-CM | POA: Diagnosis not present

## 2020-09-20 DIAGNOSIS — J309 Allergic rhinitis, unspecified: Secondary | ICD-10-CM | POA: Diagnosis not present

## 2020-09-20 DIAGNOSIS — N390 Urinary tract infection, site not specified: Secondary | ICD-10-CM | POA: Diagnosis not present

## 2020-09-27 DIAGNOSIS — K219 Gastro-esophageal reflux disease without esophagitis: Secondary | ICD-10-CM | POA: Diagnosis not present

## 2020-09-27 DIAGNOSIS — G43909 Migraine, unspecified, not intractable, without status migrainosus: Secondary | ICD-10-CM | POA: Diagnosis not present

## 2020-09-27 DIAGNOSIS — K581 Irritable bowel syndrome with constipation: Secondary | ICD-10-CM | POA: Diagnosis not present

## 2020-09-27 DIAGNOSIS — E876 Hypokalemia: Secondary | ICD-10-CM | POA: Diagnosis not present

## 2020-09-27 DIAGNOSIS — R55 Syncope and collapse: Secondary | ICD-10-CM | POA: Diagnosis not present

## 2020-09-27 DIAGNOSIS — F419 Anxiety disorder, unspecified: Secondary | ICD-10-CM | POA: Diagnosis not present

## 2020-09-27 DIAGNOSIS — J309 Allergic rhinitis, unspecified: Secondary | ICD-10-CM | POA: Diagnosis not present

## 2020-09-27 DIAGNOSIS — E1140 Type 2 diabetes mellitus with diabetic neuropathy, unspecified: Secondary | ICD-10-CM | POA: Diagnosis not present

## 2020-09-27 DIAGNOSIS — M545 Low back pain, unspecified: Secondary | ICD-10-CM | POA: Diagnosis not present

## 2020-09-27 DIAGNOSIS — N183 Chronic kidney disease, stage 3 unspecified: Secondary | ICD-10-CM | POA: Diagnosis not present

## 2020-09-27 DIAGNOSIS — R609 Edema, unspecified: Secondary | ICD-10-CM | POA: Diagnosis not present

## 2020-09-27 DIAGNOSIS — M109 Gout, unspecified: Secondary | ICD-10-CM | POA: Diagnosis not present

## 2020-10-15 IMAGING — MG MM DIGITAL DIAGNOSTIC UNILAT*R* W/ TOMO W/ CAD
4 series · 4 of 12 positions shown · non-contrast
Comparison: Previous exam(s).

CLINICAL DATA: Recall from screening mammography with
tomosynthesis, possible mass involving the INNER subareolar RIGHT
breast.

EXAM:
DIGITAL DIAGNOSTIC RIGHT MAMMOGRAM WITH TOMO
ULTRASOUND RIGHT BREAST

[R MLO synth-2D]
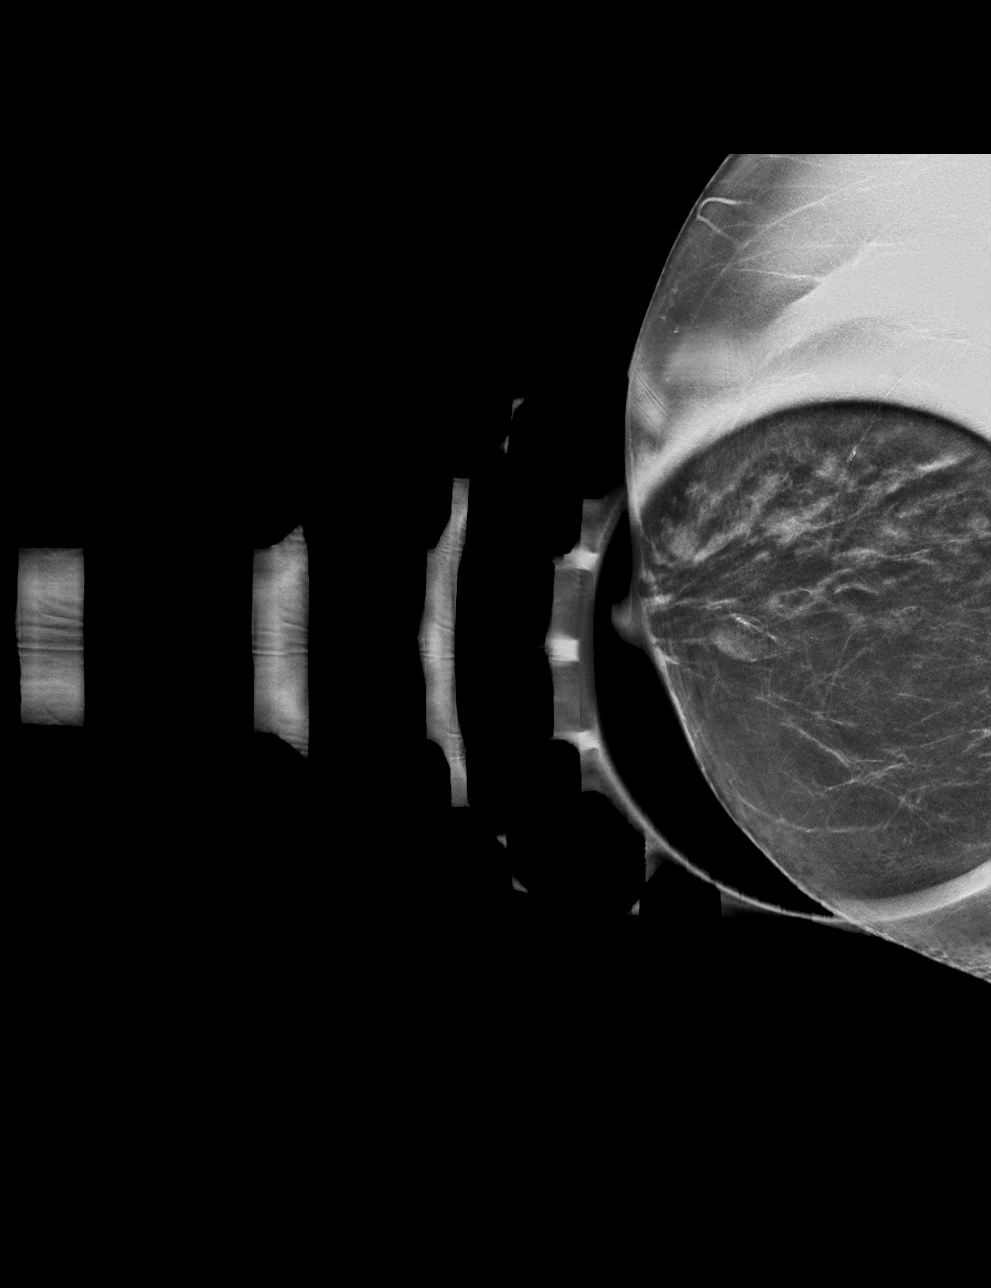

[R CC synth-2D]
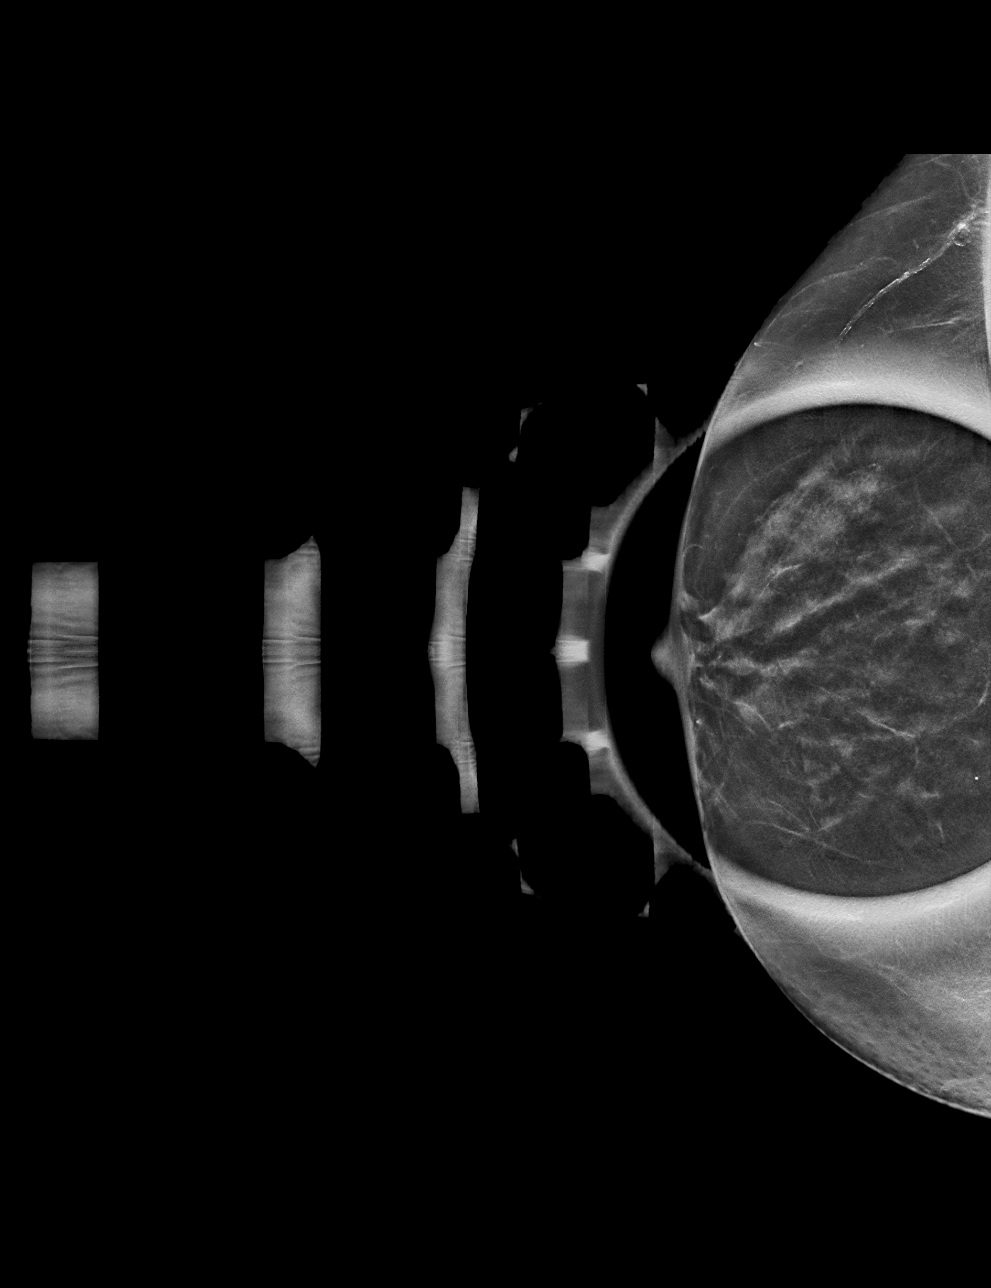

[R CC tomo · tomo slice 23/45.0]
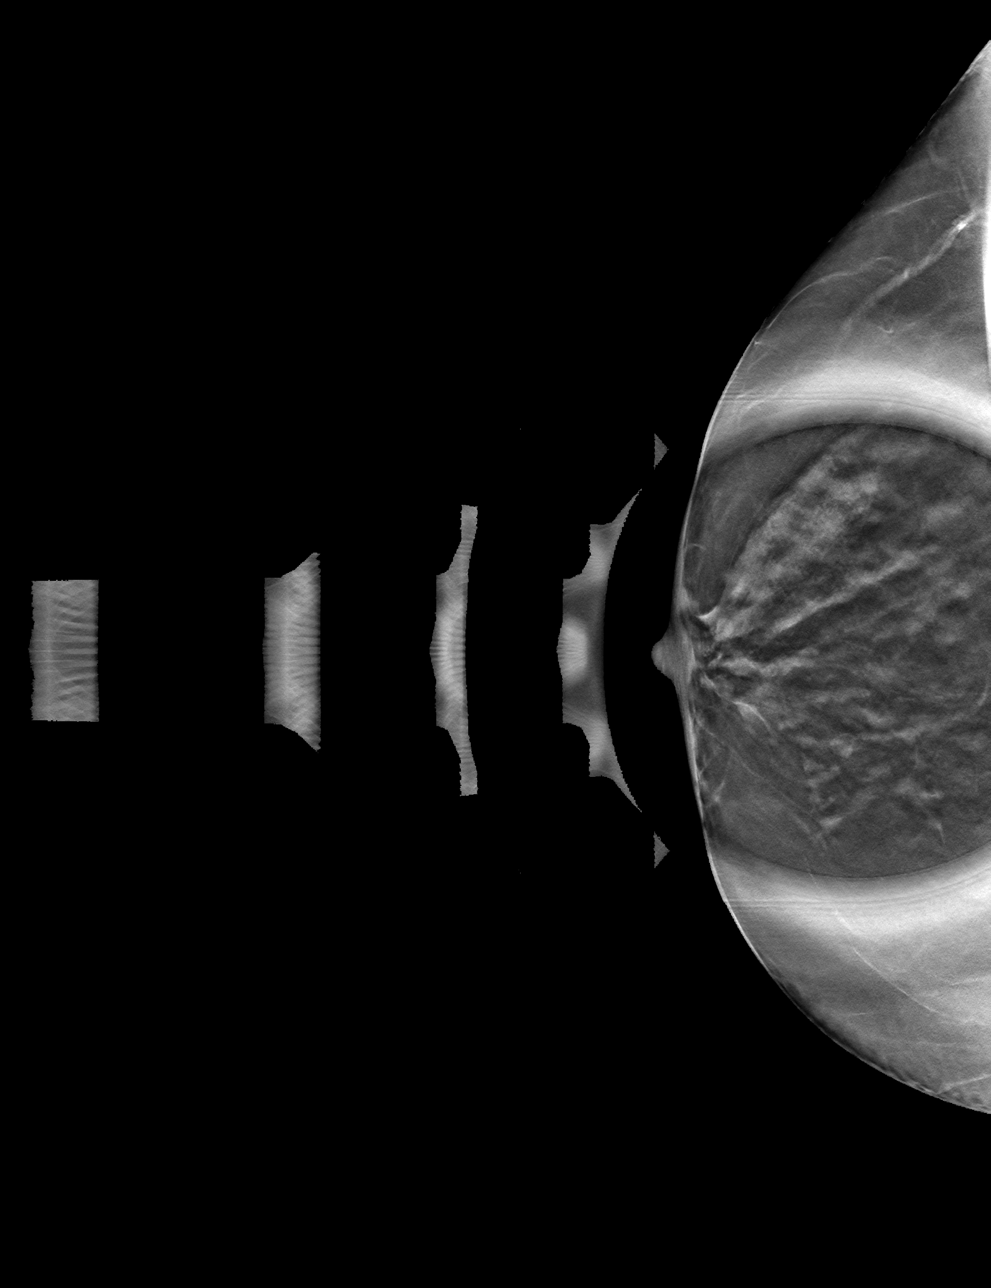

[R MLO tomo · tomo slice 26/51.0]
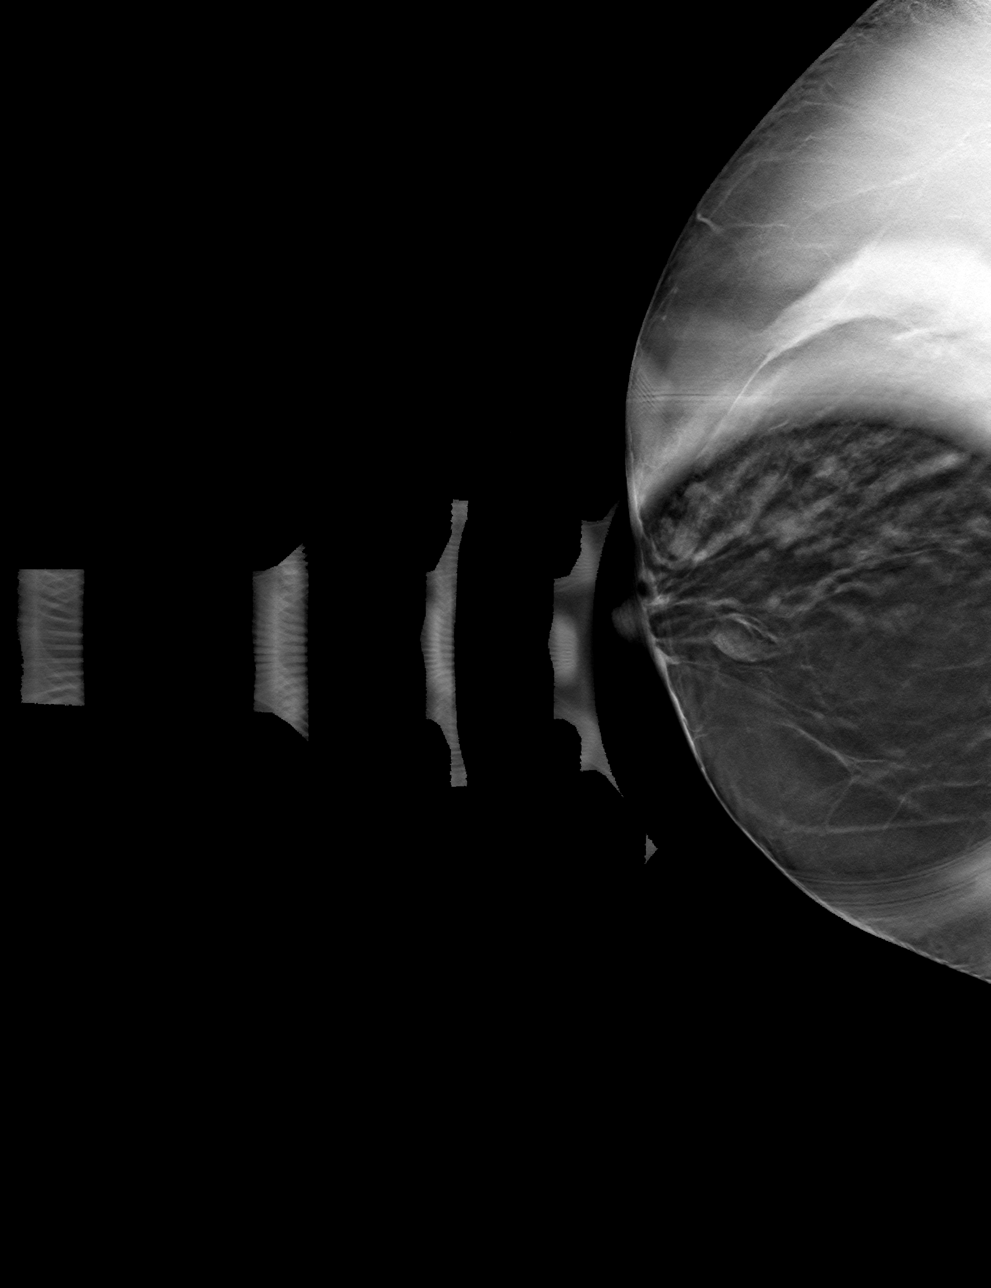

[4 of 12 positions shown; findings below may reference images not displayed]

ACR Breast Density Category b: There are scattered areas of
fibroglandular density.
FINDINGS: Tomosynthesis and synthesized spot-compression CC and MLO views of
the area of concern in the RIGHT breast were obtained.

Spot compression images confirm a new partially obscured mass whose
visible margins are circumscribed, measuring on the order of 1 cm.
There is no associated architectural distortion or suspicious
calcifications.

Targeted RIGHT breast ultrasound is performed, showing a complex
cystic and potentially solid mass at the 3 o'clock subareolar
location measuring approximately 1.1 x 0.5 x 0.9 cm, demonstrating
posterior acoustic enhancement and demonstrating no internal power
Doppler flow, corresponding to the screening mammographic finding.

Sonographic evaluation of the RIGHT axilla demonstrates no
pathologic lymphadenopathy.
IMPRESSION: 1. Indeterminate new 1.1 cm complex cystic mass at the 3 o'clock
subareolar location of the RIGHT breast.
2. No pathologic RIGHT axillary lymphadenopathy.

RECOMMENDATION:
Ultrasound-guided core needle biopsy of the indeterminate RIGHT
breast mass.

The ultrasound core needle biopsy procedure was discussed with the
patient and her questions were answered. She wishes to proceed and
the biopsy has been scheduled at her convenience.

I have discussed the findings and recommendations with the patient.

BI-RADS CATEGORY  4: Suspicious.

## 2020-10-16 DIAGNOSIS — E1140 Type 2 diabetes mellitus with diabetic neuropathy, unspecified: Secondary | ICD-10-CM | POA: Diagnosis not present

## 2020-10-16 DIAGNOSIS — R55 Syncope and collapse: Secondary | ICD-10-CM | POA: Diagnosis not present

## 2020-10-16 DIAGNOSIS — R609 Edema, unspecified: Secondary | ICD-10-CM | POA: Diagnosis not present

## 2020-10-16 DIAGNOSIS — F419 Anxiety disorder, unspecified: Secondary | ICD-10-CM | POA: Diagnosis not present

## 2020-10-16 DIAGNOSIS — N183 Chronic kidney disease, stage 3 unspecified: Secondary | ICD-10-CM | POA: Diagnosis not present

## 2020-10-16 DIAGNOSIS — K219 Gastro-esophageal reflux disease without esophagitis: Secondary | ICD-10-CM | POA: Diagnosis not present

## 2020-10-16 DIAGNOSIS — M109 Gout, unspecified: Secondary | ICD-10-CM | POA: Diagnosis not present

## 2020-10-16 DIAGNOSIS — K581 Irritable bowel syndrome with constipation: Secondary | ICD-10-CM | POA: Diagnosis not present

## 2020-10-16 DIAGNOSIS — E876 Hypokalemia: Secondary | ICD-10-CM | POA: Diagnosis not present

## 2020-10-16 DIAGNOSIS — F3341 Major depressive disorder, recurrent, in partial remission: Secondary | ICD-10-CM | POA: Diagnosis not present

## 2020-10-16 DIAGNOSIS — N390 Urinary tract infection, site not specified: Secondary | ICD-10-CM | POA: Diagnosis not present

## 2020-10-16 DIAGNOSIS — G43909 Migraine, unspecified, not intractable, without status migrainosus: Secondary | ICD-10-CM | POA: Diagnosis not present

## 2020-10-23 IMAGING — US US  BREAST BX W/ LOC DEV 1ST LESION IMG BX SPEC US GUIDE*R*
1 series · 9 of 9 positions shown · non-contrast
Comparison: Previous exam(s).
COMPARISON: Previous exam(s).

Addendum:
CLINICAL DATA: 69-year-old female with an indeterminate right
breast mass.

EXAM:
ULTRASOUND GUIDED RIGHT BREAST CORE NEEDLE BIOPSY

[Series 1: us breast bx w/ loc dev 1st lesion img bx spec us  · 0.06mm/px · 9 of 9 slices shown]
[im 1/9]
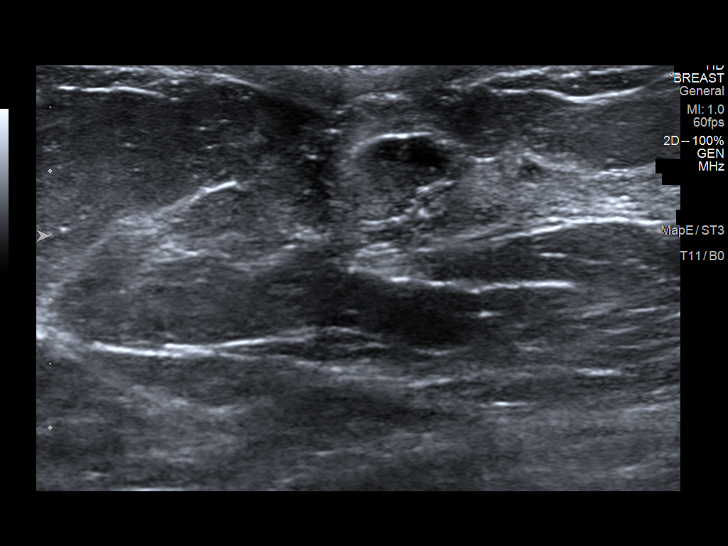
[im 2/9]
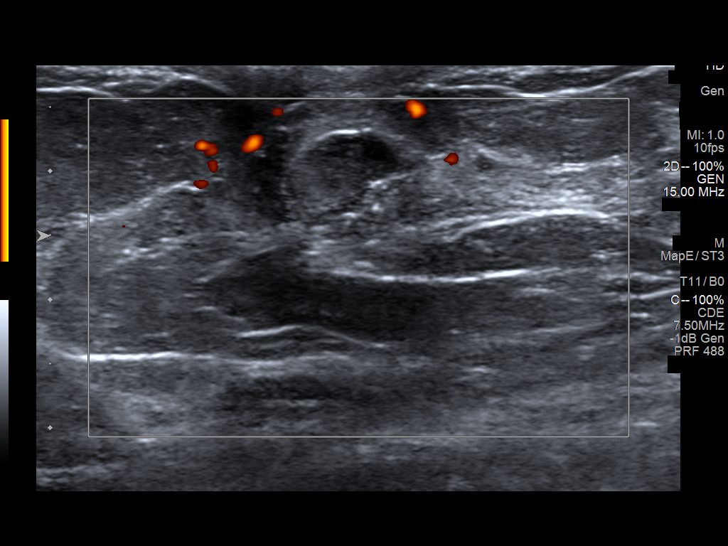
[im 3/9]
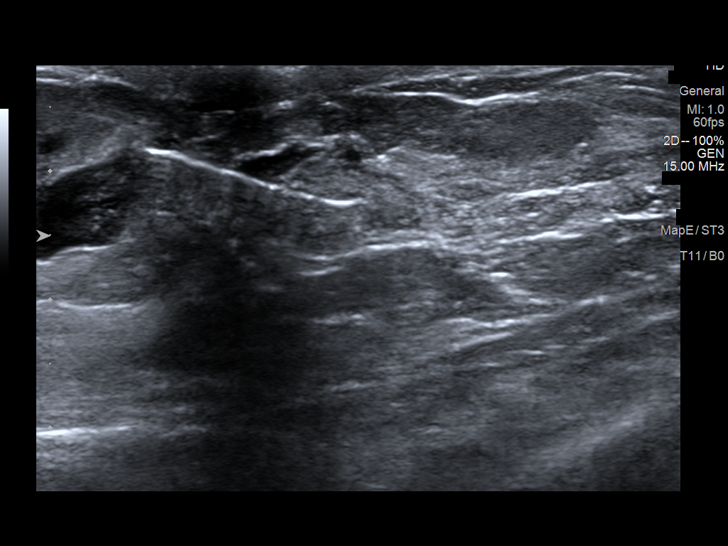
[im 4/9]
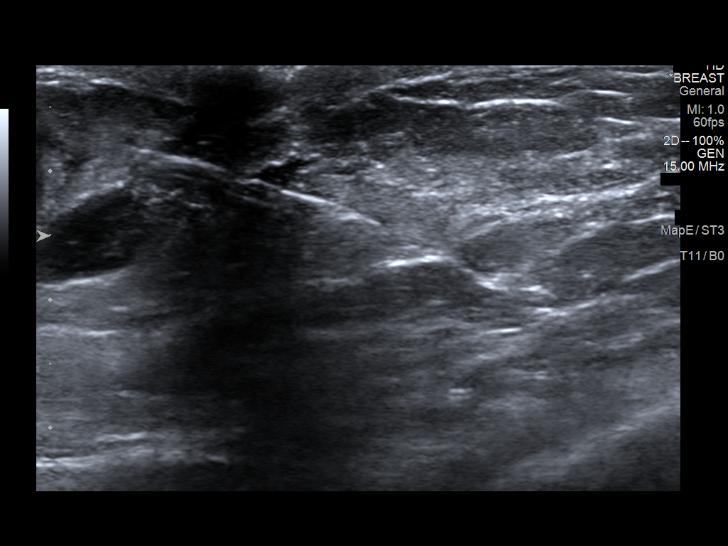
[im 5/9]
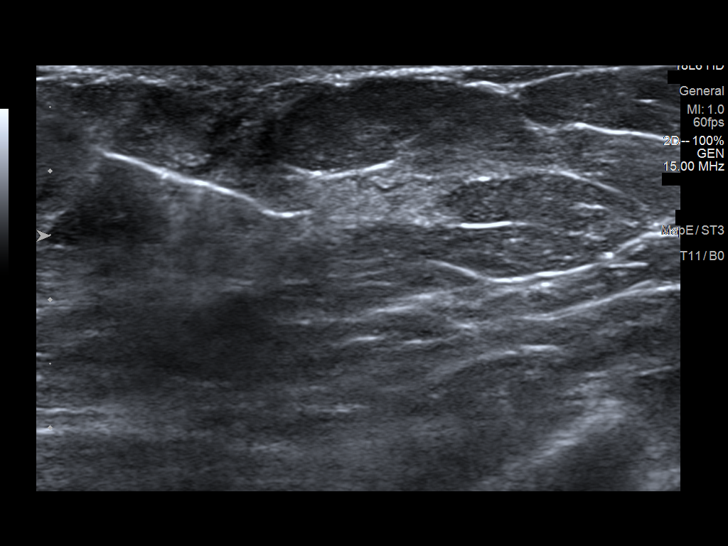
[im 6/9]
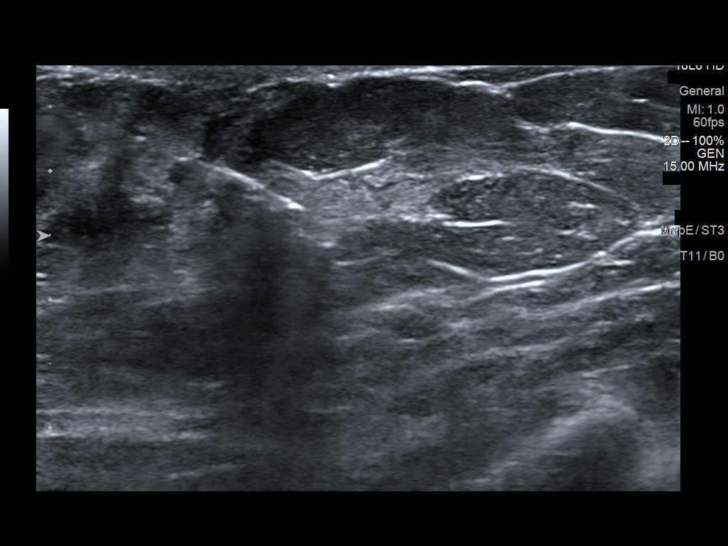
[im 7/9]
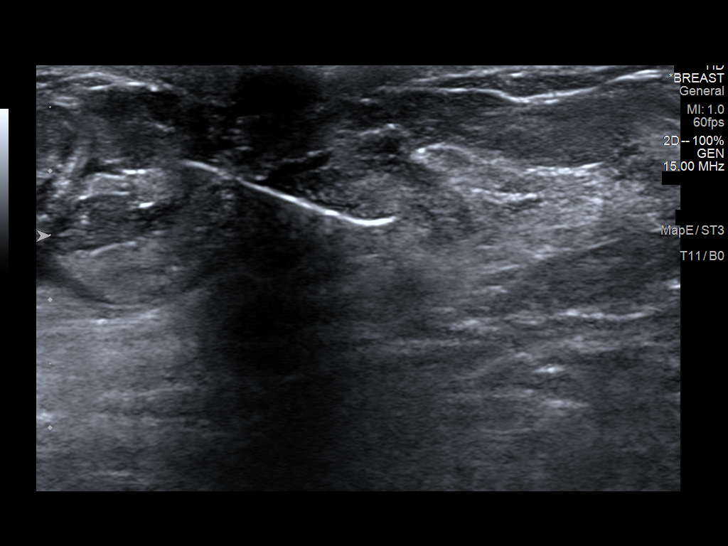
[im 8/9]
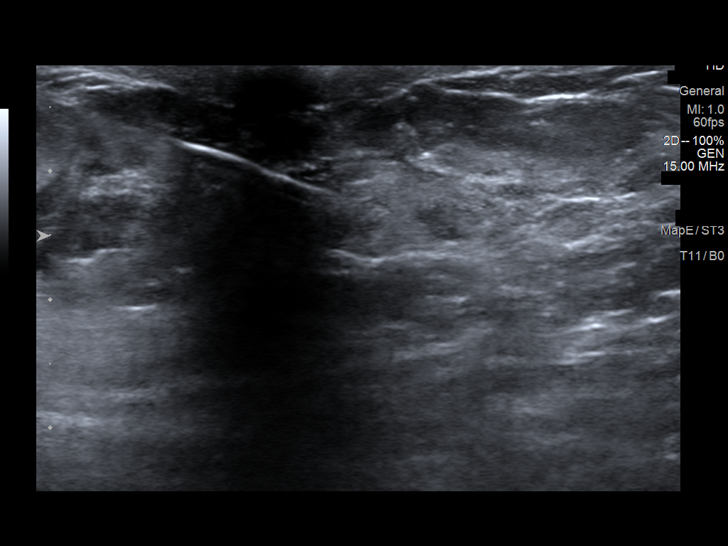
[im 9/9]
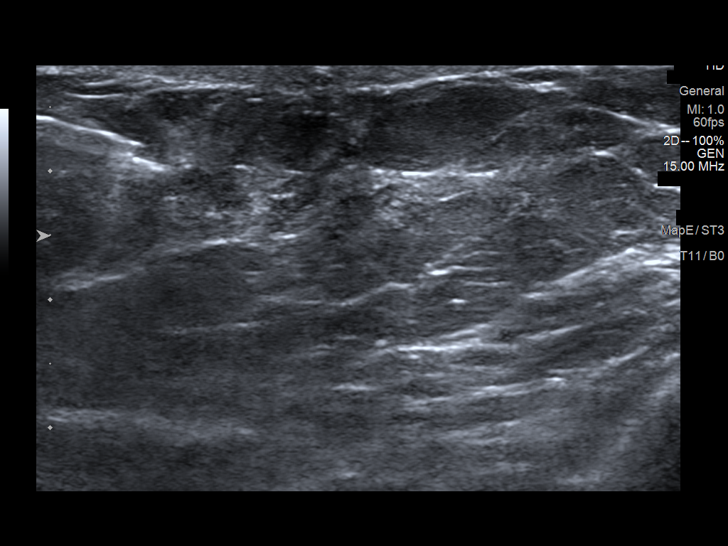

[9 of 9 positions shown; findings below may reference images not displayed]



Lesion quadrant: Upper inner quadrant

Using sterile technique and 1% Lidocaine as local anesthetic, under
direct ultrasound visualization, a 14 gauge Sibalic device was
used to perform biopsy of a mass at the 3 o'clock retroareolar
position using a inferior approach. At the conclusion of the
procedure ribbon shaped tissue marker clip was deployed into the
biopsy cavity. Follow up 2 view mammogram was performed and dictated
separately.
IMPRESSION: Ultrasound guided biopsy of the right breast. No apparent
complications.

ADDENDUM:
Pathology revealed DUCT WALL(S) WITH ASSOCIATED HISTIOCYTES, FOREIGN
BODY-TYPE GIANT CELLS AND LUMINAL DEBRIS CONSISTENT WITH RUPTURE of
the Right breast, 3 o'clock retroareolar. This was found to be
concordant by Dr. Aleyamma Wilk.

Pathology results were discussed with the patient by telephone. The
patient reported doing well after the biopsy with minimal to no
tenderness at the site. Post biopsy instructions and care were
reviewed and questions were answered. The patient was encouraged to
call The [REDACTED] for any additional
concerns.

The patient was instructed to return for annual screening

Pathology results reported by Asiful Islam Eku, RN on 04/17/2020.



Lesion quadrant: Upper inner quadrant

Using sterile technique and 1% Lidocaine as local anesthetic, under
direct ultrasound visualization, a 14 gauge Sibalic device was
used to perform biopsy of a mass at the 3 o'clock retroareolar
position using a inferior approach. At the conclusion of the
procedure ribbon shaped tissue marker clip was deployed into the
biopsy cavity. Follow up 2 view mammogram was performed and dictated
separately.
IMPRESSION: Ultrasound guided biopsy of the right breast. No apparent
complications.

## 2020-10-28 DIAGNOSIS — E1140 Type 2 diabetes mellitus with diabetic neuropathy, unspecified: Secondary | ICD-10-CM | POA: Diagnosis not present

## 2020-10-28 DIAGNOSIS — K219 Gastro-esophageal reflux disease without esophagitis: Secondary | ICD-10-CM | POA: Diagnosis not present

## 2020-10-28 DIAGNOSIS — G43909 Migraine, unspecified, not intractable, without status migrainosus: Secondary | ICD-10-CM | POA: Diagnosis not present

## 2020-10-28 DIAGNOSIS — E876 Hypokalemia: Secondary | ICD-10-CM | POA: Diagnosis not present

## 2020-10-28 DIAGNOSIS — R55 Syncope and collapse: Secondary | ICD-10-CM | POA: Diagnosis not present

## 2020-10-28 DIAGNOSIS — F3341 Major depressive disorder, recurrent, in partial remission: Secondary | ICD-10-CM | POA: Diagnosis not present

## 2020-10-28 DIAGNOSIS — N390 Urinary tract infection, site not specified: Secondary | ICD-10-CM | POA: Diagnosis not present

## 2020-10-28 DIAGNOSIS — M109 Gout, unspecified: Secondary | ICD-10-CM | POA: Diagnosis not present

## 2020-10-28 DIAGNOSIS — N183 Chronic kidney disease, stage 3 unspecified: Secondary | ICD-10-CM | POA: Diagnosis not present

## 2020-10-28 DIAGNOSIS — K581 Irritable bowel syndrome with constipation: Secondary | ICD-10-CM | POA: Diagnosis not present

## 2020-10-28 DIAGNOSIS — R609 Edema, unspecified: Secondary | ICD-10-CM | POA: Diagnosis not present

## 2020-10-28 DIAGNOSIS — F419 Anxiety disorder, unspecified: Secondary | ICD-10-CM | POA: Diagnosis not present

## 2020-11-04 DIAGNOSIS — F419 Anxiety disorder, unspecified: Secondary | ICD-10-CM | POA: Diagnosis not present

## 2020-11-04 DIAGNOSIS — E876 Hypokalemia: Secondary | ICD-10-CM | POA: Diagnosis not present

## 2020-11-04 DIAGNOSIS — K219 Gastro-esophageal reflux disease without esophagitis: Secondary | ICD-10-CM | POA: Diagnosis not present

## 2020-11-04 DIAGNOSIS — R300 Dysuria: Secondary | ICD-10-CM | POA: Diagnosis not present

## 2020-11-04 DIAGNOSIS — E1140 Type 2 diabetes mellitus with diabetic neuropathy, unspecified: Secondary | ICD-10-CM | POA: Diagnosis not present

## 2020-11-04 DIAGNOSIS — K581 Irritable bowel syndrome with constipation: Secondary | ICD-10-CM | POA: Diagnosis not present

## 2020-11-04 DIAGNOSIS — R55 Syncope and collapse: Secondary | ICD-10-CM | POA: Diagnosis not present

## 2020-11-04 DIAGNOSIS — G43909 Migraine, unspecified, not intractable, without status migrainosus: Secondary | ICD-10-CM | POA: Diagnosis not present

## 2020-11-04 DIAGNOSIS — M109 Gout, unspecified: Secondary | ICD-10-CM | POA: Diagnosis not present

## 2020-11-04 DIAGNOSIS — N183 Chronic kidney disease, stage 3 unspecified: Secondary | ICD-10-CM | POA: Diagnosis not present

## 2020-11-04 DIAGNOSIS — R609 Edema, unspecified: Secondary | ICD-10-CM | POA: Diagnosis not present

## 2020-11-04 DIAGNOSIS — R5382 Chronic fatigue, unspecified: Secondary | ICD-10-CM | POA: Diagnosis not present

## 2020-11-06 DIAGNOSIS — E1140 Type 2 diabetes mellitus with diabetic neuropathy, unspecified: Secondary | ICD-10-CM | POA: Diagnosis not present

## 2020-11-06 DIAGNOSIS — M109 Gout, unspecified: Secondary | ICD-10-CM | POA: Diagnosis not present

## 2020-11-06 DIAGNOSIS — R195 Other fecal abnormalities: Secondary | ICD-10-CM | POA: Diagnosis not present

## 2020-11-06 DIAGNOSIS — F419 Anxiety disorder, unspecified: Secondary | ICD-10-CM | POA: Diagnosis not present

## 2020-11-06 DIAGNOSIS — G43909 Migraine, unspecified, not intractable, without status migrainosus: Secondary | ICD-10-CM | POA: Diagnosis not present

## 2020-11-06 DIAGNOSIS — K219 Gastro-esophageal reflux disease without esophagitis: Secondary | ICD-10-CM | POA: Diagnosis not present

## 2020-11-06 DIAGNOSIS — K581 Irritable bowel syndrome with constipation: Secondary | ICD-10-CM | POA: Diagnosis not present

## 2020-11-06 DIAGNOSIS — R300 Dysuria: Secondary | ICD-10-CM | POA: Diagnosis not present

## 2020-11-06 DIAGNOSIS — E876 Hypokalemia: Secondary | ICD-10-CM | POA: Diagnosis not present

## 2020-11-06 DIAGNOSIS — K5730 Diverticulosis of large intestine without perforation or abscess without bleeding: Secondary | ICD-10-CM | POA: Diagnosis not present

## 2020-11-06 DIAGNOSIS — N1831 Chronic kidney disease, stage 3a: Secondary | ICD-10-CM | POA: Diagnosis not present

## 2020-11-06 DIAGNOSIS — Z1159 Encounter for screening for other viral diseases: Secondary | ICD-10-CM | POA: Diagnosis not present

## 2020-11-06 DIAGNOSIS — D649 Anemia, unspecified: Secondary | ICD-10-CM | POA: Diagnosis not present

## 2020-11-06 DIAGNOSIS — Z20828 Contact with and (suspected) exposure to other viral communicable diseases: Secondary | ICD-10-CM | POA: Diagnosis not present

## 2020-11-06 DIAGNOSIS — R55 Syncope and collapse: Secondary | ICD-10-CM | POA: Diagnosis not present

## 2020-11-12 DIAGNOSIS — N189 Chronic kidney disease, unspecified: Secondary | ICD-10-CM | POA: Diagnosis not present

## 2020-11-12 DIAGNOSIS — K449 Diaphragmatic hernia without obstruction or gangrene: Secondary | ICD-10-CM | POA: Diagnosis not present

## 2020-11-12 DIAGNOSIS — I129 Hypertensive chronic kidney disease with stage 1 through stage 4 chronic kidney disease, or unspecified chronic kidney disease: Secondary | ICD-10-CM | POA: Diagnosis not present

## 2020-11-12 DIAGNOSIS — K2970 Gastritis, unspecified, without bleeding: Secondary | ICD-10-CM | POA: Diagnosis not present

## 2020-11-12 DIAGNOSIS — M109 Gout, unspecified: Secondary | ICD-10-CM | POA: Diagnosis not present

## 2020-11-12 DIAGNOSIS — K921 Melena: Secondary | ICD-10-CM | POA: Diagnosis not present

## 2020-11-12 DIAGNOSIS — Z86010 Personal history of colonic polyps: Secondary | ICD-10-CM | POA: Diagnosis not present

## 2020-11-12 DIAGNOSIS — D649 Anemia, unspecified: Secondary | ICD-10-CM | POA: Diagnosis not present

## 2020-11-12 DIAGNOSIS — R195 Other fecal abnormalities: Secondary | ICD-10-CM | POA: Diagnosis not present

## 2020-11-12 DIAGNOSIS — E1140 Type 2 diabetes mellitus with diabetic neuropathy, unspecified: Secondary | ICD-10-CM | POA: Diagnosis not present

## 2020-11-12 DIAGNOSIS — K317 Polyp of stomach and duodenum: Secondary | ICD-10-CM | POA: Diagnosis not present

## 2020-11-12 DIAGNOSIS — K208 Other esophagitis without bleeding: Secondary | ICD-10-CM | POA: Diagnosis not present

## 2020-11-12 DIAGNOSIS — K219 Gastro-esophageal reflux disease without esophagitis: Secondary | ICD-10-CM | POA: Diagnosis not present

## 2020-11-12 DIAGNOSIS — D131 Benign neoplasm of stomach: Secondary | ICD-10-CM | POA: Diagnosis not present

## 2020-11-25 DIAGNOSIS — D649 Anemia, unspecified: Secondary | ICD-10-CM | POA: Diagnosis not present

## 2020-12-12 DIAGNOSIS — E7800 Pure hypercholesterolemia, unspecified: Secondary | ICD-10-CM | POA: Diagnosis not present

## 2020-12-12 DIAGNOSIS — R55 Syncope and collapse: Secondary | ICD-10-CM | POA: Diagnosis not present

## 2020-12-12 DIAGNOSIS — E876 Hypokalemia: Secondary | ICD-10-CM | POA: Diagnosis not present

## 2020-12-12 DIAGNOSIS — E1140 Type 2 diabetes mellitus with diabetic neuropathy, unspecified: Secondary | ICD-10-CM | POA: Diagnosis not present

## 2020-12-12 DIAGNOSIS — K581 Irritable bowel syndrome with constipation: Secondary | ICD-10-CM | POA: Diagnosis not present

## 2020-12-12 DIAGNOSIS — G43909 Migraine, unspecified, not intractable, without status migrainosus: Secondary | ICD-10-CM | POA: Diagnosis not present

## 2020-12-12 DIAGNOSIS — I10 Essential (primary) hypertension: Secondary | ICD-10-CM | POA: Diagnosis not present

## 2020-12-12 DIAGNOSIS — E118 Type 2 diabetes mellitus with unspecified complications: Secondary | ICD-10-CM | POA: Diagnosis not present

## 2020-12-12 DIAGNOSIS — M109 Gout, unspecified: Secondary | ICD-10-CM | POA: Diagnosis not present

## 2020-12-12 DIAGNOSIS — F419 Anxiety disorder, unspecified: Secondary | ICD-10-CM | POA: Diagnosis not present

## 2020-12-12 DIAGNOSIS — K219 Gastro-esophageal reflux disease without esophagitis: Secondary | ICD-10-CM | POA: Diagnosis not present

## 2020-12-12 DIAGNOSIS — R300 Dysuria: Secondary | ICD-10-CM | POA: Diagnosis not present

## 2020-12-16 DIAGNOSIS — Z86010 Personal history of colonic polyps: Secondary | ICD-10-CM | POA: Diagnosis not present

## 2020-12-16 DIAGNOSIS — D649 Anemia, unspecified: Secondary | ICD-10-CM | POA: Diagnosis not present

## 2020-12-16 DIAGNOSIS — R195 Other fecal abnormalities: Secondary | ICD-10-CM | POA: Diagnosis not present

## 2020-12-16 DIAGNOSIS — K921 Melena: Secondary | ICD-10-CM | POA: Diagnosis not present

## 2020-12-25 DIAGNOSIS — N390 Urinary tract infection, site not specified: Secondary | ICD-10-CM | POA: Diagnosis not present

## 2020-12-25 DIAGNOSIS — K581 Irritable bowel syndrome with constipation: Secondary | ICD-10-CM | POA: Diagnosis not present

## 2020-12-25 DIAGNOSIS — M109 Gout, unspecified: Secondary | ICD-10-CM | POA: Diagnosis not present

## 2020-12-25 DIAGNOSIS — K219 Gastro-esophageal reflux disease without esophagitis: Secondary | ICD-10-CM | POA: Diagnosis not present

## 2020-12-25 DIAGNOSIS — E7800 Pure hypercholesterolemia, unspecified: Secondary | ICD-10-CM | POA: Diagnosis not present

## 2020-12-25 DIAGNOSIS — F419 Anxiety disorder, unspecified: Secondary | ICD-10-CM | POA: Diagnosis not present

## 2020-12-25 DIAGNOSIS — I10 Essential (primary) hypertension: Secondary | ICD-10-CM | POA: Diagnosis not present

## 2020-12-25 DIAGNOSIS — R609 Edema, unspecified: Secondary | ICD-10-CM | POA: Diagnosis not present

## 2020-12-25 DIAGNOSIS — E1140 Type 2 diabetes mellitus with diabetic neuropathy, unspecified: Secondary | ICD-10-CM | POA: Diagnosis not present

## 2020-12-25 DIAGNOSIS — R55 Syncope and collapse: Secondary | ICD-10-CM | POA: Diagnosis not present

## 2020-12-25 DIAGNOSIS — G43909 Migraine, unspecified, not intractable, without status migrainosus: Secondary | ICD-10-CM | POA: Diagnosis not present

## 2020-12-25 DIAGNOSIS — E876 Hypokalemia: Secondary | ICD-10-CM | POA: Diagnosis not present

## 2021-01-08 DIAGNOSIS — E7800 Pure hypercholesterolemia, unspecified: Secondary | ICD-10-CM | POA: Diagnosis not present

## 2021-01-08 DIAGNOSIS — I10 Essential (primary) hypertension: Secondary | ICD-10-CM | POA: Diagnosis not present

## 2021-01-08 DIAGNOSIS — M109 Gout, unspecified: Secondary | ICD-10-CM | POA: Diagnosis not present

## 2021-01-08 DIAGNOSIS — K219 Gastro-esophageal reflux disease without esophagitis: Secondary | ICD-10-CM | POA: Diagnosis not present

## 2021-01-08 DIAGNOSIS — F419 Anxiety disorder, unspecified: Secondary | ICD-10-CM | POA: Diagnosis not present

## 2021-01-08 DIAGNOSIS — E118 Type 2 diabetes mellitus with unspecified complications: Secondary | ICD-10-CM | POA: Diagnosis not present

## 2021-01-08 DIAGNOSIS — K581 Irritable bowel syndrome with constipation: Secondary | ICD-10-CM | POA: Diagnosis not present

## 2021-01-08 DIAGNOSIS — R55 Syncope and collapse: Secondary | ICD-10-CM | POA: Diagnosis not present

## 2021-01-08 DIAGNOSIS — E1140 Type 2 diabetes mellitus with diabetic neuropathy, unspecified: Secondary | ICD-10-CM | POA: Diagnosis not present

## 2021-01-08 DIAGNOSIS — R609 Edema, unspecified: Secondary | ICD-10-CM | POA: Diagnosis not present

## 2021-01-08 DIAGNOSIS — G43909 Migraine, unspecified, not intractable, without status migrainosus: Secondary | ICD-10-CM | POA: Diagnosis not present

## 2021-01-08 DIAGNOSIS — E876 Hypokalemia: Secondary | ICD-10-CM | POA: Diagnosis not present

## 2021-01-16 DIAGNOSIS — K581 Irritable bowel syndrome with constipation: Secondary | ICD-10-CM | POA: Diagnosis not present

## 2021-01-16 DIAGNOSIS — F419 Anxiety disorder, unspecified: Secondary | ICD-10-CM | POA: Diagnosis not present

## 2021-01-16 DIAGNOSIS — E876 Hypokalemia: Secondary | ICD-10-CM | POA: Diagnosis not present

## 2021-01-16 DIAGNOSIS — N390 Urinary tract infection, site not specified: Secondary | ICD-10-CM | POA: Diagnosis not present

## 2021-01-16 DIAGNOSIS — E7800 Pure hypercholesterolemia, unspecified: Secondary | ICD-10-CM | POA: Diagnosis not present

## 2021-01-16 DIAGNOSIS — K219 Gastro-esophageal reflux disease without esophagitis: Secondary | ICD-10-CM | POA: Diagnosis not present

## 2021-01-16 DIAGNOSIS — J309 Allergic rhinitis, unspecified: Secondary | ICD-10-CM | POA: Diagnosis not present

## 2021-01-16 DIAGNOSIS — R609 Edema, unspecified: Secondary | ICD-10-CM | POA: Diagnosis not present

## 2021-01-16 DIAGNOSIS — R55 Syncope and collapse: Secondary | ICD-10-CM | POA: Diagnosis not present

## 2021-01-16 DIAGNOSIS — E1140 Type 2 diabetes mellitus with diabetic neuropathy, unspecified: Secondary | ICD-10-CM | POA: Diagnosis not present

## 2021-01-16 DIAGNOSIS — M109 Gout, unspecified: Secondary | ICD-10-CM | POA: Diagnosis not present

## 2021-01-16 DIAGNOSIS — G43909 Migraine, unspecified, not intractable, without status migrainosus: Secondary | ICD-10-CM | POA: Diagnosis not present

## 2021-02-10 DIAGNOSIS — M545 Low back pain, unspecified: Secondary | ICD-10-CM | POA: Diagnosis not present

## 2021-02-10 DIAGNOSIS — E876 Hypokalemia: Secondary | ICD-10-CM | POA: Diagnosis not present

## 2021-02-10 DIAGNOSIS — G43909 Migraine, unspecified, not intractable, without status migrainosus: Secondary | ICD-10-CM | POA: Diagnosis not present

## 2021-02-10 DIAGNOSIS — R399 Unspecified symptoms and signs involving the genitourinary system: Secondary | ICD-10-CM | POA: Diagnosis not present

## 2021-02-10 DIAGNOSIS — R55 Syncope and collapse: Secondary | ICD-10-CM | POA: Diagnosis not present

## 2021-02-10 DIAGNOSIS — F419 Anxiety disorder, unspecified: Secondary | ICD-10-CM | POA: Diagnosis not present

## 2021-02-10 DIAGNOSIS — K219 Gastro-esophageal reflux disease without esophagitis: Secondary | ICD-10-CM | POA: Diagnosis not present

## 2021-02-10 DIAGNOSIS — R609 Edema, unspecified: Secondary | ICD-10-CM | POA: Diagnosis not present

## 2021-02-10 DIAGNOSIS — J309 Allergic rhinitis, unspecified: Secondary | ICD-10-CM | POA: Diagnosis not present

## 2021-02-10 DIAGNOSIS — E1140 Type 2 diabetes mellitus with diabetic neuropathy, unspecified: Secondary | ICD-10-CM | POA: Diagnosis not present

## 2021-02-10 DIAGNOSIS — M109 Gout, unspecified: Secondary | ICD-10-CM | POA: Diagnosis not present

## 2021-02-10 DIAGNOSIS — K581 Irritable bowel syndrome with constipation: Secondary | ICD-10-CM | POA: Diagnosis not present

## 2021-03-11 DIAGNOSIS — R55 Syncope and collapse: Secondary | ICD-10-CM | POA: Diagnosis not present

## 2021-03-11 DIAGNOSIS — K581 Irritable bowel syndrome with constipation: Secondary | ICD-10-CM | POA: Diagnosis not present

## 2021-03-11 DIAGNOSIS — R399 Unspecified symptoms and signs involving the genitourinary system: Secondary | ICD-10-CM | POA: Diagnosis not present

## 2021-03-11 DIAGNOSIS — E876 Hypokalemia: Secondary | ICD-10-CM | POA: Diagnosis not present

## 2021-03-11 DIAGNOSIS — F419 Anxiety disorder, unspecified: Secondary | ICD-10-CM | POA: Diagnosis not present

## 2021-03-11 DIAGNOSIS — G43909 Migraine, unspecified, not intractable, without status migrainosus: Secondary | ICD-10-CM | POA: Diagnosis not present

## 2021-03-11 DIAGNOSIS — K219 Gastro-esophageal reflux disease without esophagitis: Secondary | ICD-10-CM | POA: Diagnosis not present

## 2021-03-11 DIAGNOSIS — M109 Gout, unspecified: Secondary | ICD-10-CM | POA: Diagnosis not present

## 2021-03-11 DIAGNOSIS — R609 Edema, unspecified: Secondary | ICD-10-CM | POA: Diagnosis not present

## 2021-03-11 DIAGNOSIS — J309 Allergic rhinitis, unspecified: Secondary | ICD-10-CM | POA: Diagnosis not present

## 2021-03-11 DIAGNOSIS — G4700 Insomnia, unspecified: Secondary | ICD-10-CM | POA: Diagnosis not present

## 2021-03-11 DIAGNOSIS — E1140 Type 2 diabetes mellitus with diabetic neuropathy, unspecified: Secondary | ICD-10-CM | POA: Diagnosis not present

## 2021-04-11 DIAGNOSIS — I10 Essential (primary) hypertension: Secondary | ICD-10-CM | POA: Diagnosis not present

## 2021-04-11 DIAGNOSIS — R55 Syncope and collapse: Secondary | ICD-10-CM | POA: Diagnosis not present

## 2021-04-11 DIAGNOSIS — E1122 Type 2 diabetes mellitus with diabetic chronic kidney disease: Secondary | ICD-10-CM | POA: Diagnosis not present

## 2021-04-11 DIAGNOSIS — G43909 Migraine, unspecified, not intractable, without status migrainosus: Secondary | ICD-10-CM | POA: Diagnosis not present

## 2021-04-11 DIAGNOSIS — F419 Anxiety disorder, unspecified: Secondary | ICD-10-CM | POA: Diagnosis not present

## 2021-04-11 DIAGNOSIS — E876 Hypokalemia: Secondary | ICD-10-CM | POA: Diagnosis not present

## 2021-04-11 DIAGNOSIS — D649 Anemia, unspecified: Secondary | ICD-10-CM | POA: Diagnosis not present

## 2021-04-11 DIAGNOSIS — M109 Gout, unspecified: Secondary | ICD-10-CM | POA: Diagnosis not present

## 2021-04-11 DIAGNOSIS — G4700 Insomnia, unspecified: Secondary | ICD-10-CM | POA: Diagnosis not present

## 2021-04-11 DIAGNOSIS — M25561 Pain in right knee: Secondary | ICD-10-CM | POA: Diagnosis not present

## 2021-04-11 DIAGNOSIS — E7800 Pure hypercholesterolemia, unspecified: Secondary | ICD-10-CM | POA: Diagnosis not present

## 2021-04-11 DIAGNOSIS — K219 Gastro-esophageal reflux disease without esophagitis: Secondary | ICD-10-CM | POA: Diagnosis not present

## 2021-04-11 DIAGNOSIS — E1140 Type 2 diabetes mellitus with diabetic neuropathy, unspecified: Secondary | ICD-10-CM | POA: Diagnosis not present

## 2021-05-07 DIAGNOSIS — E876 Hypokalemia: Secondary | ICD-10-CM | POA: Diagnosis not present

## 2021-05-07 DIAGNOSIS — M545 Low back pain, unspecified: Secondary | ICD-10-CM | POA: Diagnosis not present

## 2021-05-07 DIAGNOSIS — K219 Gastro-esophageal reflux disease without esophagitis: Secondary | ICD-10-CM | POA: Diagnosis not present

## 2021-05-07 DIAGNOSIS — G43909 Migraine, unspecified, not intractable, without status migrainosus: Secondary | ICD-10-CM | POA: Diagnosis not present

## 2021-05-07 DIAGNOSIS — G4700 Insomnia, unspecified: Secondary | ICD-10-CM | POA: Diagnosis not present

## 2021-05-07 DIAGNOSIS — F419 Anxiety disorder, unspecified: Secondary | ICD-10-CM | POA: Diagnosis not present

## 2021-05-07 DIAGNOSIS — R609 Edema, unspecified: Secondary | ICD-10-CM | POA: Diagnosis not present

## 2021-05-07 DIAGNOSIS — M109 Gout, unspecified: Secondary | ICD-10-CM | POA: Diagnosis not present

## 2021-05-07 DIAGNOSIS — K581 Irritable bowel syndrome with constipation: Secondary | ICD-10-CM | POA: Diagnosis not present

## 2021-05-07 DIAGNOSIS — R55 Syncope and collapse: Secondary | ICD-10-CM | POA: Diagnosis not present

## 2021-05-07 DIAGNOSIS — J309 Allergic rhinitis, unspecified: Secondary | ICD-10-CM | POA: Diagnosis not present

## 2021-05-07 DIAGNOSIS — E1140 Type 2 diabetes mellitus with diabetic neuropathy, unspecified: Secondary | ICD-10-CM | POA: Diagnosis not present

## 2021-06-06 DIAGNOSIS — R55 Syncope and collapse: Secondary | ICD-10-CM | POA: Diagnosis not present

## 2021-06-06 DIAGNOSIS — G4700 Insomnia, unspecified: Secondary | ICD-10-CM | POA: Diagnosis not present

## 2021-06-06 DIAGNOSIS — K219 Gastro-esophageal reflux disease without esophagitis: Secondary | ICD-10-CM | POA: Diagnosis not present

## 2021-06-06 DIAGNOSIS — E876 Hypokalemia: Secondary | ICD-10-CM | POA: Diagnosis not present

## 2021-06-06 DIAGNOSIS — M109 Gout, unspecified: Secondary | ICD-10-CM | POA: Diagnosis not present

## 2021-06-06 DIAGNOSIS — F3341 Major depressive disorder, recurrent, in partial remission: Secondary | ICD-10-CM | POA: Diagnosis not present

## 2021-06-06 DIAGNOSIS — N1831 Chronic kidney disease, stage 3a: Secondary | ICD-10-CM | POA: Diagnosis not present

## 2021-06-06 DIAGNOSIS — D464 Refractory anemia, unspecified: Secondary | ICD-10-CM | POA: Diagnosis not present

## 2021-06-06 DIAGNOSIS — E1140 Type 2 diabetes mellitus with diabetic neuropathy, unspecified: Secondary | ICD-10-CM | POA: Diagnosis not present

## 2021-06-06 DIAGNOSIS — I10 Essential (primary) hypertension: Secondary | ICD-10-CM | POA: Diagnosis not present

## 2021-06-06 DIAGNOSIS — F419 Anxiety disorder, unspecified: Secondary | ICD-10-CM | POA: Diagnosis not present

## 2021-06-06 DIAGNOSIS — E1122 Type 2 diabetes mellitus with diabetic chronic kidney disease: Secondary | ICD-10-CM | POA: Diagnosis not present

## 2021-06-11 DIAGNOSIS — Z139 Encounter for screening, unspecified: Secondary | ICD-10-CM | POA: Diagnosis not present

## 2021-06-11 DIAGNOSIS — Z1331 Encounter for screening for depression: Secondary | ICD-10-CM | POA: Diagnosis not present

## 2021-06-11 DIAGNOSIS — E785 Hyperlipidemia, unspecified: Secondary | ICD-10-CM | POA: Diagnosis not present

## 2021-06-11 DIAGNOSIS — Z9181 History of falling: Secondary | ICD-10-CM | POA: Diagnosis not present

## 2021-06-11 DIAGNOSIS — Z0000 Encounter for general adult medical examination without abnormal findings: Secondary | ICD-10-CM | POA: Diagnosis not present

## 2021-07-02 DIAGNOSIS — R55 Syncope and collapse: Secondary | ICD-10-CM | POA: Diagnosis not present

## 2021-07-02 DIAGNOSIS — E1140 Type 2 diabetes mellitus with diabetic neuropathy, unspecified: Secondary | ICD-10-CM | POA: Diagnosis not present

## 2021-07-02 DIAGNOSIS — E7800 Pure hypercholesterolemia, unspecified: Secondary | ICD-10-CM | POA: Diagnosis not present

## 2021-07-02 DIAGNOSIS — D464 Refractory anemia, unspecified: Secondary | ICD-10-CM | POA: Diagnosis not present

## 2021-07-02 DIAGNOSIS — F419 Anxiety disorder, unspecified: Secondary | ICD-10-CM | POA: Diagnosis not present

## 2021-07-02 DIAGNOSIS — G4700 Insomnia, unspecified: Secondary | ICD-10-CM | POA: Diagnosis not present

## 2021-07-02 DIAGNOSIS — M109 Gout, unspecified: Secondary | ICD-10-CM | POA: Diagnosis not present

## 2021-07-02 DIAGNOSIS — N1831 Chronic kidney disease, stage 3a: Secondary | ICD-10-CM | POA: Diagnosis not present

## 2021-07-02 DIAGNOSIS — F3341 Major depressive disorder, recurrent, in partial remission: Secondary | ICD-10-CM | POA: Diagnosis not present

## 2021-07-02 DIAGNOSIS — K219 Gastro-esophageal reflux disease without esophagitis: Secondary | ICD-10-CM | POA: Diagnosis not present

## 2021-07-02 DIAGNOSIS — E876 Hypokalemia: Secondary | ICD-10-CM | POA: Diagnosis not present

## 2021-07-02 DIAGNOSIS — I10 Essential (primary) hypertension: Secondary | ICD-10-CM | POA: Diagnosis not present

## 2021-07-02 DIAGNOSIS — E1122 Type 2 diabetes mellitus with diabetic chronic kidney disease: Secondary | ICD-10-CM | POA: Diagnosis not present

## 2021-08-01 DIAGNOSIS — K219 Gastro-esophageal reflux disease without esophagitis: Secondary | ICD-10-CM | POA: Diagnosis not present

## 2021-08-01 DIAGNOSIS — G4700 Insomnia, unspecified: Secondary | ICD-10-CM | POA: Diagnosis not present

## 2021-08-01 DIAGNOSIS — E1122 Type 2 diabetes mellitus with diabetic chronic kidney disease: Secondary | ICD-10-CM | POA: Diagnosis not present

## 2021-08-01 DIAGNOSIS — I10 Essential (primary) hypertension: Secondary | ICD-10-CM | POA: Diagnosis not present

## 2021-08-01 DIAGNOSIS — E1140 Type 2 diabetes mellitus with diabetic neuropathy, unspecified: Secondary | ICD-10-CM | POA: Diagnosis not present

## 2021-08-01 DIAGNOSIS — D464 Refractory anemia, unspecified: Secondary | ICD-10-CM | POA: Diagnosis not present

## 2021-08-01 DIAGNOSIS — E876 Hypokalemia: Secondary | ICD-10-CM | POA: Diagnosis not present

## 2021-08-01 DIAGNOSIS — R55 Syncope and collapse: Secondary | ICD-10-CM | POA: Diagnosis not present

## 2021-08-01 DIAGNOSIS — M109 Gout, unspecified: Secondary | ICD-10-CM | POA: Diagnosis not present

## 2021-08-01 DIAGNOSIS — F419 Anxiety disorder, unspecified: Secondary | ICD-10-CM | POA: Diagnosis not present

## 2021-08-01 DIAGNOSIS — F3341 Major depressive disorder, recurrent, in partial remission: Secondary | ICD-10-CM | POA: Diagnosis not present

## 2021-08-01 DIAGNOSIS — N1831 Chronic kidney disease, stage 3a: Secondary | ICD-10-CM | POA: Diagnosis not present

## 2021-08-28 DIAGNOSIS — E876 Hypokalemia: Secondary | ICD-10-CM | POA: Diagnosis not present

## 2021-08-28 DIAGNOSIS — G4700 Insomnia, unspecified: Secondary | ICD-10-CM | POA: Diagnosis not present

## 2021-08-28 DIAGNOSIS — D464 Refractory anemia, unspecified: Secondary | ICD-10-CM | POA: Diagnosis not present

## 2021-08-28 DIAGNOSIS — E1140 Type 2 diabetes mellitus with diabetic neuropathy, unspecified: Secondary | ICD-10-CM | POA: Diagnosis not present

## 2021-08-28 DIAGNOSIS — M109 Gout, unspecified: Secondary | ICD-10-CM | POA: Diagnosis not present

## 2021-08-28 DIAGNOSIS — I10 Essential (primary) hypertension: Secondary | ICD-10-CM | POA: Diagnosis not present

## 2021-08-28 DIAGNOSIS — R55 Syncope and collapse: Secondary | ICD-10-CM | POA: Diagnosis not present

## 2021-08-28 DIAGNOSIS — K219 Gastro-esophageal reflux disease without esophagitis: Secondary | ICD-10-CM | POA: Diagnosis not present

## 2021-08-28 DIAGNOSIS — F419 Anxiety disorder, unspecified: Secondary | ICD-10-CM | POA: Diagnosis not present

## 2021-08-28 DIAGNOSIS — N1831 Chronic kidney disease, stage 3a: Secondary | ICD-10-CM | POA: Diagnosis not present

## 2021-08-28 DIAGNOSIS — E1122 Type 2 diabetes mellitus with diabetic chronic kidney disease: Secondary | ICD-10-CM | POA: Diagnosis not present

## 2021-08-28 DIAGNOSIS — F3341 Major depressive disorder, recurrent, in partial remission: Secondary | ICD-10-CM | POA: Diagnosis not present

## 2021-09-19 DIAGNOSIS — Z1231 Encounter for screening mammogram for malignant neoplasm of breast: Secondary | ICD-10-CM | POA: Diagnosis not present

## 2021-09-25 DIAGNOSIS — D464 Refractory anemia, unspecified: Secondary | ICD-10-CM | POA: Diagnosis not present

## 2021-09-25 DIAGNOSIS — E7800 Pure hypercholesterolemia, unspecified: Secondary | ICD-10-CM | POA: Diagnosis not present

## 2021-09-25 DIAGNOSIS — E1140 Type 2 diabetes mellitus with diabetic neuropathy, unspecified: Secondary | ICD-10-CM | POA: Diagnosis not present

## 2021-09-25 DIAGNOSIS — K581 Irritable bowel syndrome with constipation: Secondary | ICD-10-CM | POA: Diagnosis not present

## 2021-09-25 DIAGNOSIS — N390 Urinary tract infection, site not specified: Secondary | ICD-10-CM | POA: Diagnosis not present

## 2021-09-25 DIAGNOSIS — M109 Gout, unspecified: Secondary | ICD-10-CM | POA: Diagnosis not present

## 2021-09-25 DIAGNOSIS — I10 Essential (primary) hypertension: Secondary | ICD-10-CM | POA: Diagnosis not present

## 2021-09-25 DIAGNOSIS — G43909 Migraine, unspecified, not intractable, without status migrainosus: Secondary | ICD-10-CM | POA: Diagnosis not present

## 2021-09-25 DIAGNOSIS — K219 Gastro-esophageal reflux disease without esophagitis: Secondary | ICD-10-CM | POA: Diagnosis not present

## 2021-09-25 DIAGNOSIS — E1122 Type 2 diabetes mellitus with diabetic chronic kidney disease: Secondary | ICD-10-CM | POA: Diagnosis not present

## 2021-09-25 DIAGNOSIS — F419 Anxiety disorder, unspecified: Secondary | ICD-10-CM | POA: Diagnosis not present

## 2021-09-25 DIAGNOSIS — J309 Allergic rhinitis, unspecified: Secondary | ICD-10-CM | POA: Diagnosis not present

## 2021-09-25 DIAGNOSIS — R55 Syncope and collapse: Secondary | ICD-10-CM | POA: Diagnosis not present

## 2021-09-25 DIAGNOSIS — R609 Edema, unspecified: Secondary | ICD-10-CM | POA: Diagnosis not present

## 2021-09-25 DIAGNOSIS — E876 Hypokalemia: Secondary | ICD-10-CM | POA: Diagnosis not present

## 2021-10-23 ENCOUNTER — Telehealth: Payer: Self-pay

## 2021-10-23 DIAGNOSIS — K219 Gastro-esophageal reflux disease without esophagitis: Secondary | ICD-10-CM | POA: Diagnosis not present

## 2021-10-23 DIAGNOSIS — G4700 Insomnia, unspecified: Secondary | ICD-10-CM | POA: Diagnosis not present

## 2021-10-23 DIAGNOSIS — I10 Essential (primary) hypertension: Secondary | ICD-10-CM | POA: Diagnosis not present

## 2021-10-23 DIAGNOSIS — M109 Gout, unspecified: Secondary | ICD-10-CM | POA: Diagnosis not present

## 2021-10-23 DIAGNOSIS — D464 Refractory anemia, unspecified: Secondary | ICD-10-CM | POA: Diagnosis not present

## 2021-10-23 DIAGNOSIS — R300 Dysuria: Secondary | ICD-10-CM | POA: Diagnosis not present

## 2021-10-23 DIAGNOSIS — R55 Syncope and collapse: Secondary | ICD-10-CM | POA: Diagnosis not present

## 2021-10-23 DIAGNOSIS — M25511 Pain in right shoulder: Secondary | ICD-10-CM | POA: Diagnosis not present

## 2021-10-23 DIAGNOSIS — E1140 Type 2 diabetes mellitus with diabetic neuropathy, unspecified: Secondary | ICD-10-CM | POA: Diagnosis not present

## 2021-10-23 DIAGNOSIS — N1831 Chronic kidney disease, stage 3a: Secondary | ICD-10-CM | POA: Diagnosis not present

## 2021-10-23 DIAGNOSIS — F419 Anxiety disorder, unspecified: Secondary | ICD-10-CM | POA: Diagnosis not present

## 2021-10-23 DIAGNOSIS — E1122 Type 2 diabetes mellitus with diabetic chronic kidney disease: Secondary | ICD-10-CM | POA: Diagnosis not present

## 2021-10-23 NOTE — Telephone Encounter (Signed)
I have called patient and left a voicemail regarding recall colonoscopy. Dr Rosalio Loud like her to see him in the office first to discuss colonoscopy.  ?

## 2021-10-24 NOTE — Telephone Encounter (Signed)
Patient returned call stated she just had a colonoscopy in February 2023 with Dr. Jennye Boroughs. Declined recall with Dr. Chales Abrahams. ?

## 2021-11-20 DIAGNOSIS — I10 Essential (primary) hypertension: Secondary | ICD-10-CM | POA: Diagnosis not present

## 2021-11-20 DIAGNOSIS — E1140 Type 2 diabetes mellitus with diabetic neuropathy, unspecified: Secondary | ICD-10-CM | POA: Diagnosis not present

## 2021-11-20 DIAGNOSIS — G43909 Migraine, unspecified, not intractable, without status migrainosus: Secondary | ICD-10-CM | POA: Diagnosis not present

## 2021-11-20 DIAGNOSIS — F419 Anxiety disorder, unspecified: Secondary | ICD-10-CM | POA: Diagnosis not present

## 2021-11-20 DIAGNOSIS — E876 Hypokalemia: Secondary | ICD-10-CM | POA: Diagnosis not present

## 2021-11-20 DIAGNOSIS — R609 Edema, unspecified: Secondary | ICD-10-CM | POA: Diagnosis not present

## 2021-11-20 DIAGNOSIS — E1122 Type 2 diabetes mellitus with diabetic chronic kidney disease: Secondary | ICD-10-CM | POA: Diagnosis not present

## 2021-11-20 DIAGNOSIS — M25511 Pain in right shoulder: Secondary | ICD-10-CM | POA: Diagnosis not present

## 2021-11-20 DIAGNOSIS — K219 Gastro-esophageal reflux disease without esophagitis: Secondary | ICD-10-CM | POA: Diagnosis not present

## 2021-11-20 DIAGNOSIS — K581 Irritable bowel syndrome with constipation: Secondary | ICD-10-CM | POA: Diagnosis not present

## 2021-11-20 DIAGNOSIS — M109 Gout, unspecified: Secondary | ICD-10-CM | POA: Diagnosis not present

## 2021-11-20 DIAGNOSIS — R55 Syncope and collapse: Secondary | ICD-10-CM | POA: Diagnosis not present

## 2021-12-16 DIAGNOSIS — I10 Essential (primary) hypertension: Secondary | ICD-10-CM | POA: Diagnosis not present

## 2021-12-16 DIAGNOSIS — R55 Syncope and collapse: Secondary | ICD-10-CM | POA: Diagnosis not present

## 2021-12-16 DIAGNOSIS — K219 Gastro-esophageal reflux disease without esophagitis: Secondary | ICD-10-CM | POA: Diagnosis not present

## 2021-12-16 DIAGNOSIS — N3281 Overactive bladder: Secondary | ICD-10-CM | POA: Diagnosis not present

## 2021-12-16 DIAGNOSIS — N390 Urinary tract infection, site not specified: Secondary | ICD-10-CM | POA: Diagnosis not present

## 2021-12-16 DIAGNOSIS — E1140 Type 2 diabetes mellitus with diabetic neuropathy, unspecified: Secondary | ICD-10-CM | POA: Diagnosis not present

## 2021-12-16 DIAGNOSIS — E7800 Pure hypercholesterolemia, unspecified: Secondary | ICD-10-CM | POA: Diagnosis not present

## 2021-12-16 DIAGNOSIS — F419 Anxiety disorder, unspecified: Secondary | ICD-10-CM | POA: Diagnosis not present

## 2021-12-16 DIAGNOSIS — G43909 Migraine, unspecified, not intractable, without status migrainosus: Secondary | ICD-10-CM | POA: Diagnosis not present

## 2021-12-16 DIAGNOSIS — K581 Irritable bowel syndrome with constipation: Secondary | ICD-10-CM | POA: Diagnosis not present

## 2021-12-16 DIAGNOSIS — M109 Gout, unspecified: Secondary | ICD-10-CM | POA: Diagnosis not present

## 2021-12-16 DIAGNOSIS — E876 Hypokalemia: Secondary | ICD-10-CM | POA: Diagnosis not present

## 2022-01-01 DIAGNOSIS — E1140 Type 2 diabetes mellitus with diabetic neuropathy, unspecified: Secondary | ICD-10-CM | POA: Diagnosis not present

## 2022-01-01 DIAGNOSIS — E1122 Type 2 diabetes mellitus with diabetic chronic kidney disease: Secondary | ICD-10-CM | POA: Diagnosis not present

## 2022-01-01 DIAGNOSIS — F419 Anxiety disorder, unspecified: Secondary | ICD-10-CM | POA: Diagnosis not present

## 2022-01-01 DIAGNOSIS — E876 Hypokalemia: Secondary | ICD-10-CM | POA: Diagnosis not present

## 2022-01-01 DIAGNOSIS — R609 Edema, unspecified: Secondary | ICD-10-CM | POA: Diagnosis not present

## 2022-01-01 DIAGNOSIS — E7800 Pure hypercholesterolemia, unspecified: Secondary | ICD-10-CM | POA: Diagnosis not present

## 2022-01-01 DIAGNOSIS — N3281 Overactive bladder: Secondary | ICD-10-CM | POA: Diagnosis not present

## 2022-01-01 DIAGNOSIS — D464 Refractory anemia, unspecified: Secondary | ICD-10-CM | POA: Diagnosis not present

## 2022-01-01 DIAGNOSIS — K581 Irritable bowel syndrome with constipation: Secondary | ICD-10-CM | POA: Diagnosis not present

## 2022-01-01 DIAGNOSIS — G43909 Migraine, unspecified, not intractable, without status migrainosus: Secondary | ICD-10-CM | POA: Diagnosis not present

## 2022-01-01 DIAGNOSIS — K219 Gastro-esophageal reflux disease without esophagitis: Secondary | ICD-10-CM | POA: Diagnosis not present

## 2022-01-01 DIAGNOSIS — R55 Syncope and collapse: Secondary | ICD-10-CM | POA: Diagnosis not present

## 2022-01-01 DIAGNOSIS — M109 Gout, unspecified: Secondary | ICD-10-CM | POA: Diagnosis not present

## 2022-01-01 DIAGNOSIS — I10 Essential (primary) hypertension: Secondary | ICD-10-CM | POA: Diagnosis not present

## 2022-03-06 DIAGNOSIS — K219 Gastro-esophageal reflux disease without esophagitis: Secondary | ICD-10-CM | POA: Diagnosis not present

## 2022-03-06 DIAGNOSIS — E876 Hypokalemia: Secondary | ICD-10-CM | POA: Diagnosis not present

## 2022-03-06 DIAGNOSIS — N3281 Overactive bladder: Secondary | ICD-10-CM | POA: Diagnosis not present

## 2022-03-06 DIAGNOSIS — G43909 Migraine, unspecified, not intractable, without status migrainosus: Secondary | ICD-10-CM | POA: Diagnosis not present

## 2022-03-06 DIAGNOSIS — I10 Essential (primary) hypertension: Secondary | ICD-10-CM | POA: Diagnosis not present

## 2022-03-06 DIAGNOSIS — F419 Anxiety disorder, unspecified: Secondary | ICD-10-CM | POA: Diagnosis not present

## 2022-03-06 DIAGNOSIS — R55 Syncope and collapse: Secondary | ICD-10-CM | POA: Diagnosis not present

## 2022-03-06 DIAGNOSIS — R300 Dysuria: Secondary | ICD-10-CM | POA: Diagnosis not present

## 2022-03-06 DIAGNOSIS — K581 Irritable bowel syndrome with constipation: Secondary | ICD-10-CM | POA: Diagnosis not present

## 2022-03-06 DIAGNOSIS — E1140 Type 2 diabetes mellitus with diabetic neuropathy, unspecified: Secondary | ICD-10-CM | POA: Diagnosis not present

## 2022-03-06 DIAGNOSIS — E7800 Pure hypercholesterolemia, unspecified: Secondary | ICD-10-CM | POA: Diagnosis not present

## 2022-03-06 DIAGNOSIS — M109 Gout, unspecified: Secondary | ICD-10-CM | POA: Diagnosis not present

## 2022-03-25 DIAGNOSIS — N1831 Chronic kidney disease, stage 3a: Secondary | ICD-10-CM | POA: Diagnosis not present

## 2022-03-25 DIAGNOSIS — M545 Low back pain, unspecified: Secondary | ICD-10-CM | POA: Diagnosis not present

## 2022-03-25 DIAGNOSIS — N3281 Overactive bladder: Secondary | ICD-10-CM | POA: Diagnosis not present

## 2022-03-25 DIAGNOSIS — M159 Polyosteoarthritis, unspecified: Secondary | ICD-10-CM | POA: Diagnosis not present

## 2022-03-25 DIAGNOSIS — G43909 Migraine, unspecified, not intractable, without status migrainosus: Secondary | ICD-10-CM | POA: Diagnosis not present

## 2022-03-25 DIAGNOSIS — R609 Edema, unspecified: Secondary | ICD-10-CM | POA: Diagnosis not present

## 2022-03-25 DIAGNOSIS — J309 Allergic rhinitis, unspecified: Secondary | ICD-10-CM | POA: Diagnosis not present

## 2022-03-25 DIAGNOSIS — K219 Gastro-esophageal reflux disease without esophagitis: Secondary | ICD-10-CM | POA: Diagnosis not present

## 2022-03-25 DIAGNOSIS — F3341 Major depressive disorder, recurrent, in partial remission: Secondary | ICD-10-CM | POA: Diagnosis not present

## 2022-03-25 DIAGNOSIS — M25561 Pain in right knee: Secondary | ICD-10-CM | POA: Diagnosis not present

## 2022-03-25 DIAGNOSIS — E876 Hypokalemia: Secondary | ICD-10-CM | POA: Diagnosis not present

## 2022-03-25 DIAGNOSIS — K581 Irritable bowel syndrome with constipation: Secondary | ICD-10-CM | POA: Diagnosis not present

## 2022-04-01 DIAGNOSIS — M25561 Pain in right knee: Secondary | ICD-10-CM | POA: Diagnosis not present

## 2022-04-01 DIAGNOSIS — M159 Polyosteoarthritis, unspecified: Secondary | ICD-10-CM | POA: Diagnosis not present

## 2022-04-17 DIAGNOSIS — I10 Essential (primary) hypertension: Secondary | ICD-10-CM | POA: Diagnosis not present

## 2022-04-17 DIAGNOSIS — E7800 Pure hypercholesterolemia, unspecified: Secondary | ICD-10-CM | POA: Diagnosis not present

## 2022-04-17 DIAGNOSIS — R5382 Chronic fatigue, unspecified: Secondary | ICD-10-CM | POA: Diagnosis not present

## 2022-04-17 DIAGNOSIS — M25561 Pain in right knee: Secondary | ICD-10-CM | POA: Diagnosis not present

## 2022-04-17 DIAGNOSIS — M545 Low back pain, unspecified: Secondary | ICD-10-CM | POA: Diagnosis not present

## 2022-04-17 DIAGNOSIS — M109 Gout, unspecified: Secondary | ICD-10-CM | POA: Diagnosis not present

## 2022-04-17 DIAGNOSIS — E1122 Type 2 diabetes mellitus with diabetic chronic kidney disease: Secondary | ICD-10-CM | POA: Diagnosis not present

## 2022-04-17 DIAGNOSIS — J309 Allergic rhinitis, unspecified: Secondary | ICD-10-CM | POA: Diagnosis not present

## 2022-04-17 DIAGNOSIS — R609 Edema, unspecified: Secondary | ICD-10-CM | POA: Diagnosis not present

## 2022-04-17 DIAGNOSIS — N1831 Chronic kidney disease, stage 3a: Secondary | ICD-10-CM | POA: Diagnosis not present

## 2022-04-17 DIAGNOSIS — N3281 Overactive bladder: Secondary | ICD-10-CM | POA: Diagnosis not present

## 2022-04-17 DIAGNOSIS — E1140 Type 2 diabetes mellitus with diabetic neuropathy, unspecified: Secondary | ICD-10-CM | POA: Diagnosis not present

## 2022-04-17 DIAGNOSIS — K581 Irritable bowel syndrome with constipation: Secondary | ICD-10-CM | POA: Diagnosis not present

## 2022-04-17 DIAGNOSIS — D464 Refractory anemia, unspecified: Secondary | ICD-10-CM | POA: Diagnosis not present

## 2022-05-02 DIAGNOSIS — N1831 Chronic kidney disease, stage 3a: Secondary | ICD-10-CM | POA: Diagnosis not present

## 2022-05-02 DIAGNOSIS — D464 Refractory anemia, unspecified: Secondary | ICD-10-CM | POA: Diagnosis not present

## 2022-05-02 DIAGNOSIS — N390 Urinary tract infection, site not specified: Secondary | ICD-10-CM | POA: Diagnosis not present

## 2022-05-02 DIAGNOSIS — E1122 Type 2 diabetes mellitus with diabetic chronic kidney disease: Secondary | ICD-10-CM | POA: Diagnosis not present

## 2022-05-02 DIAGNOSIS — J309 Allergic rhinitis, unspecified: Secondary | ICD-10-CM | POA: Diagnosis not present

## 2022-05-02 DIAGNOSIS — N3281 Overactive bladder: Secondary | ICD-10-CM | POA: Diagnosis not present

## 2022-05-02 DIAGNOSIS — K581 Irritable bowel syndrome with constipation: Secondary | ICD-10-CM | POA: Diagnosis not present

## 2022-05-02 DIAGNOSIS — I10 Essential (primary) hypertension: Secondary | ICD-10-CM | POA: Diagnosis not present

## 2022-05-02 DIAGNOSIS — E1140 Type 2 diabetes mellitus with diabetic neuropathy, unspecified: Secondary | ICD-10-CM | POA: Diagnosis not present

## 2022-05-02 DIAGNOSIS — M545 Low back pain, unspecified: Secondary | ICD-10-CM | POA: Diagnosis not present

## 2022-05-02 DIAGNOSIS — R609 Edema, unspecified: Secondary | ICD-10-CM | POA: Diagnosis not present

## 2022-05-02 DIAGNOSIS — M109 Gout, unspecified: Secondary | ICD-10-CM | POA: Diagnosis not present

## 2022-05-06 DIAGNOSIS — N3281 Overactive bladder: Secondary | ICD-10-CM | POA: Diagnosis not present

## 2022-05-06 DIAGNOSIS — I10 Essential (primary) hypertension: Secondary | ICD-10-CM | POA: Diagnosis not present

## 2022-05-06 DIAGNOSIS — D464 Refractory anemia, unspecified: Secondary | ICD-10-CM | POA: Diagnosis not present

## 2022-05-06 DIAGNOSIS — E1140 Type 2 diabetes mellitus with diabetic neuropathy, unspecified: Secondary | ICD-10-CM | POA: Diagnosis not present

## 2022-05-06 DIAGNOSIS — E1122 Type 2 diabetes mellitus with diabetic chronic kidney disease: Secondary | ICD-10-CM | POA: Diagnosis not present

## 2022-05-06 DIAGNOSIS — M109 Gout, unspecified: Secondary | ICD-10-CM | POA: Diagnosis not present

## 2022-05-06 DIAGNOSIS — K581 Irritable bowel syndrome with constipation: Secondary | ICD-10-CM | POA: Diagnosis not present

## 2022-05-06 DIAGNOSIS — J309 Allergic rhinitis, unspecified: Secondary | ICD-10-CM | POA: Diagnosis not present

## 2022-05-06 DIAGNOSIS — R609 Edema, unspecified: Secondary | ICD-10-CM | POA: Diagnosis not present

## 2022-05-06 DIAGNOSIS — M545 Low back pain, unspecified: Secondary | ICD-10-CM | POA: Diagnosis not present

## 2022-05-06 DIAGNOSIS — N1831 Chronic kidney disease, stage 3a: Secondary | ICD-10-CM | POA: Diagnosis not present

## 2022-05-06 DIAGNOSIS — N390 Urinary tract infection, site not specified: Secondary | ICD-10-CM | POA: Diagnosis not present

## 2022-06-03 DIAGNOSIS — N1831 Chronic kidney disease, stage 3a: Secondary | ICD-10-CM | POA: Diagnosis not present

## 2022-06-03 DIAGNOSIS — J309 Allergic rhinitis, unspecified: Secondary | ICD-10-CM | POA: Diagnosis not present

## 2022-06-03 DIAGNOSIS — N390 Urinary tract infection, site not specified: Secondary | ICD-10-CM | POA: Diagnosis not present

## 2022-06-03 DIAGNOSIS — N3281 Overactive bladder: Secondary | ICD-10-CM | POA: Diagnosis not present

## 2022-06-03 DIAGNOSIS — E1140 Type 2 diabetes mellitus with diabetic neuropathy, unspecified: Secondary | ICD-10-CM | POA: Diagnosis not present

## 2022-06-03 DIAGNOSIS — D464 Refractory anemia, unspecified: Secondary | ICD-10-CM | POA: Diagnosis not present

## 2022-06-03 DIAGNOSIS — K581 Irritable bowel syndrome with constipation: Secondary | ICD-10-CM | POA: Diagnosis not present

## 2022-06-03 DIAGNOSIS — M545 Low back pain, unspecified: Secondary | ICD-10-CM | POA: Diagnosis not present

## 2022-06-03 DIAGNOSIS — E1122 Type 2 diabetes mellitus with diabetic chronic kidney disease: Secondary | ICD-10-CM | POA: Diagnosis not present

## 2022-06-03 DIAGNOSIS — M109 Gout, unspecified: Secondary | ICD-10-CM | POA: Diagnosis not present

## 2022-06-03 DIAGNOSIS — R609 Edema, unspecified: Secondary | ICD-10-CM | POA: Diagnosis not present

## 2022-06-03 DIAGNOSIS — I10 Essential (primary) hypertension: Secondary | ICD-10-CM | POA: Diagnosis not present

## 2022-06-17 DIAGNOSIS — E1140 Type 2 diabetes mellitus with diabetic neuropathy, unspecified: Secondary | ICD-10-CM | POA: Diagnosis not present

## 2022-06-17 DIAGNOSIS — I10 Essential (primary) hypertension: Secondary | ICD-10-CM | POA: Diagnosis not present

## 2022-06-17 DIAGNOSIS — N3281 Overactive bladder: Secondary | ICD-10-CM | POA: Diagnosis not present

## 2022-06-17 DIAGNOSIS — D464 Refractory anemia, unspecified: Secondary | ICD-10-CM | POA: Diagnosis not present

## 2022-06-17 DIAGNOSIS — M545 Low back pain, unspecified: Secondary | ICD-10-CM | POA: Diagnosis not present

## 2022-06-17 DIAGNOSIS — N1831 Chronic kidney disease, stage 3a: Secondary | ICD-10-CM | POA: Diagnosis not present

## 2022-06-17 DIAGNOSIS — R300 Dysuria: Secondary | ICD-10-CM | POA: Diagnosis not present

## 2022-06-17 DIAGNOSIS — R609 Edema, unspecified: Secondary | ICD-10-CM | POA: Diagnosis not present

## 2022-06-17 DIAGNOSIS — M109 Gout, unspecified: Secondary | ICD-10-CM | POA: Diagnosis not present

## 2022-06-17 DIAGNOSIS — K581 Irritable bowel syndrome with constipation: Secondary | ICD-10-CM | POA: Diagnosis not present

## 2022-06-17 DIAGNOSIS — J309 Allergic rhinitis, unspecified: Secondary | ICD-10-CM | POA: Diagnosis not present

## 2022-06-17 DIAGNOSIS — E1122 Type 2 diabetes mellitus with diabetic chronic kidney disease: Secondary | ICD-10-CM | POA: Diagnosis not present

## 2022-07-03 DIAGNOSIS — J309 Allergic rhinitis, unspecified: Secondary | ICD-10-CM | POA: Diagnosis not present

## 2022-07-03 DIAGNOSIS — M159 Polyosteoarthritis, unspecified: Secondary | ICD-10-CM | POA: Diagnosis not present

## 2022-07-03 DIAGNOSIS — D464 Refractory anemia, unspecified: Secondary | ICD-10-CM | POA: Diagnosis not present

## 2022-07-03 DIAGNOSIS — R609 Edema, unspecified: Secondary | ICD-10-CM | POA: Diagnosis not present

## 2022-07-03 DIAGNOSIS — M545 Low back pain, unspecified: Secondary | ICD-10-CM | POA: Diagnosis not present

## 2022-07-03 DIAGNOSIS — N3281 Overactive bladder: Secondary | ICD-10-CM | POA: Diagnosis not present

## 2022-07-03 DIAGNOSIS — I10 Essential (primary) hypertension: Secondary | ICD-10-CM | POA: Diagnosis not present

## 2022-07-03 DIAGNOSIS — E1122 Type 2 diabetes mellitus with diabetic chronic kidney disease: Secondary | ICD-10-CM | POA: Diagnosis not present

## 2022-07-03 DIAGNOSIS — E1140 Type 2 diabetes mellitus with diabetic neuropathy, unspecified: Secondary | ICD-10-CM | POA: Diagnosis not present

## 2022-07-03 DIAGNOSIS — E7800 Pure hypercholesterolemia, unspecified: Secondary | ICD-10-CM | POA: Diagnosis not present

## 2022-07-03 DIAGNOSIS — N1831 Chronic kidney disease, stage 3a: Secondary | ICD-10-CM | POA: Diagnosis not present

## 2022-07-03 DIAGNOSIS — M109 Gout, unspecified: Secondary | ICD-10-CM | POA: Diagnosis not present

## 2022-07-03 DIAGNOSIS — K581 Irritable bowel syndrome with constipation: Secondary | ICD-10-CM | POA: Diagnosis not present

## 2022-07-24 DIAGNOSIS — N3281 Overactive bladder: Secondary | ICD-10-CM | POA: Diagnosis not present

## 2022-07-24 DIAGNOSIS — I10 Essential (primary) hypertension: Secondary | ICD-10-CM | POA: Diagnosis not present

## 2022-07-24 DIAGNOSIS — E1140 Type 2 diabetes mellitus with diabetic neuropathy, unspecified: Secondary | ICD-10-CM | POA: Diagnosis not present

## 2022-07-24 DIAGNOSIS — M159 Polyosteoarthritis, unspecified: Secondary | ICD-10-CM | POA: Diagnosis not present

## 2022-07-24 DIAGNOSIS — K581 Irritable bowel syndrome with constipation: Secondary | ICD-10-CM | POA: Diagnosis not present

## 2022-07-24 DIAGNOSIS — D464 Refractory anemia, unspecified: Secondary | ICD-10-CM | POA: Diagnosis not present

## 2022-07-24 DIAGNOSIS — N1831 Chronic kidney disease, stage 3a: Secondary | ICD-10-CM | POA: Diagnosis not present

## 2022-07-24 DIAGNOSIS — J309 Allergic rhinitis, unspecified: Secondary | ICD-10-CM | POA: Diagnosis not present

## 2022-07-24 DIAGNOSIS — R609 Edema, unspecified: Secondary | ICD-10-CM | POA: Diagnosis not present

## 2022-07-24 DIAGNOSIS — M109 Gout, unspecified: Secondary | ICD-10-CM | POA: Diagnosis not present

## 2022-07-24 DIAGNOSIS — M545 Low back pain, unspecified: Secondary | ICD-10-CM | POA: Diagnosis not present

## 2022-07-24 DIAGNOSIS — E1122 Type 2 diabetes mellitus with diabetic chronic kidney disease: Secondary | ICD-10-CM | POA: Diagnosis not present

## 2022-08-21 DIAGNOSIS — N3281 Overactive bladder: Secondary | ICD-10-CM | POA: Diagnosis not present

## 2022-08-21 DIAGNOSIS — I10 Essential (primary) hypertension: Secondary | ICD-10-CM | POA: Diagnosis not present

## 2022-08-21 DIAGNOSIS — M545 Low back pain, unspecified: Secondary | ICD-10-CM | POA: Diagnosis not present

## 2022-08-21 DIAGNOSIS — D464 Refractory anemia, unspecified: Secondary | ICD-10-CM | POA: Diagnosis not present

## 2022-08-21 DIAGNOSIS — M109 Gout, unspecified: Secondary | ICD-10-CM | POA: Diagnosis not present

## 2022-08-21 DIAGNOSIS — N1831 Chronic kidney disease, stage 3a: Secondary | ICD-10-CM | POA: Diagnosis not present

## 2022-08-21 DIAGNOSIS — E7800 Pure hypercholesterolemia, unspecified: Secondary | ICD-10-CM | POA: Diagnosis not present

## 2022-08-21 DIAGNOSIS — K581 Irritable bowel syndrome with constipation: Secondary | ICD-10-CM | POA: Diagnosis not present

## 2022-08-21 DIAGNOSIS — J309 Allergic rhinitis, unspecified: Secondary | ICD-10-CM | POA: Diagnosis not present

## 2022-08-21 DIAGNOSIS — E1140 Type 2 diabetes mellitus with diabetic neuropathy, unspecified: Secondary | ICD-10-CM | POA: Diagnosis not present

## 2022-08-21 DIAGNOSIS — R609 Edema, unspecified: Secondary | ICD-10-CM | POA: Diagnosis not present

## 2022-08-21 DIAGNOSIS — E1122 Type 2 diabetes mellitus with diabetic chronic kidney disease: Secondary | ICD-10-CM | POA: Diagnosis not present

## 2022-09-04 DIAGNOSIS — E1140 Type 2 diabetes mellitus with diabetic neuropathy, unspecified: Secondary | ICD-10-CM | POA: Diagnosis not present

## 2022-09-04 DIAGNOSIS — N1831 Chronic kidney disease, stage 3a: Secondary | ICD-10-CM | POA: Diagnosis not present

## 2022-09-04 DIAGNOSIS — N3281 Overactive bladder: Secondary | ICD-10-CM | POA: Diagnosis not present

## 2022-09-04 DIAGNOSIS — M545 Low back pain, unspecified: Secondary | ICD-10-CM | POA: Diagnosis not present

## 2022-09-04 DIAGNOSIS — E1122 Type 2 diabetes mellitus with diabetic chronic kidney disease: Secondary | ICD-10-CM | POA: Diagnosis not present

## 2022-09-04 DIAGNOSIS — D464 Refractory anemia, unspecified: Secondary | ICD-10-CM | POA: Diagnosis not present

## 2022-09-04 DIAGNOSIS — R609 Edema, unspecified: Secondary | ICD-10-CM | POA: Diagnosis not present

## 2022-09-04 DIAGNOSIS — I10 Essential (primary) hypertension: Secondary | ICD-10-CM | POA: Diagnosis not present

## 2022-09-04 DIAGNOSIS — M109 Gout, unspecified: Secondary | ICD-10-CM | POA: Diagnosis not present

## 2022-09-04 DIAGNOSIS — E7800 Pure hypercholesterolemia, unspecified: Secondary | ICD-10-CM | POA: Diagnosis not present

## 2022-09-04 DIAGNOSIS — K581 Irritable bowel syndrome with constipation: Secondary | ICD-10-CM | POA: Diagnosis not present

## 2022-09-04 DIAGNOSIS — J309 Allergic rhinitis, unspecified: Secondary | ICD-10-CM | POA: Diagnosis not present

## 2023-06-15 ENCOUNTER — Encounter (INDEPENDENT_AMBULATORY_CARE_PROVIDER_SITE_OTHER): Payer: Self-pay | Admitting: Otolaryngology

## 2023-07-21 ENCOUNTER — Telehealth (INDEPENDENT_AMBULATORY_CARE_PROVIDER_SITE_OTHER): Payer: Self-pay | Admitting: Otolaryngology

## 2023-07-21 NOTE — Telephone Encounter (Signed)
Called the office of Dr. Simone Curia from [facility] and left vmail msg requesting office visit notes be faxed to [place].

## 2023-07-22 ENCOUNTER — Ambulatory Visit (INDEPENDENT_AMBULATORY_CARE_PROVIDER_SITE_OTHER): Payer: PPO | Admitting: Otolaryngology

## 2023-07-22 ENCOUNTER — Ambulatory Visit (INDEPENDENT_AMBULATORY_CARE_PROVIDER_SITE_OTHER): Payer: Self-pay | Admitting: Audiology

## 2023-07-22 ENCOUNTER — Encounter (INDEPENDENT_AMBULATORY_CARE_PROVIDER_SITE_OTHER): Payer: Self-pay

## 2023-07-22 VITALS — Ht 62.0 in | Wt 140.0 lb

## 2023-07-22 DIAGNOSIS — H903 Sensorineural hearing loss, bilateral: Secondary | ICD-10-CM

## 2023-07-22 DIAGNOSIS — H9313 Tinnitus, bilateral: Secondary | ICD-10-CM

## 2023-07-22 NOTE — Progress Notes (Signed)
  [address], [address] [place], [place] [address] [phone]  Audiological Evaluation    Name: Joy Campbell     DOB:   [birth year]      MRN:   [id number]                                                                                     Service Date: 07/22/2023        Patient was referred today for a hearing evaluation by Dr. Allena Katz.   Symptoms Yes Details  Hearing loss  [x]  Patient reported perceiving hearing loss.  Tinnitus  [x]  Patient reported experiencing occasional tinnitus.  Balance problems  [x]  Patient reported previous vertigo sensations.  Previous ear surgeries  []  Patient denied any previous ear surgeries.  Family history  []  Patient denied family history of hearing loss.  Amplification  []  Patient denied the use of hearing aids.    Otoscopy: Right ear: Clear external ear canal; notable landmarks visualized on the tympanic membrane. Left ear: Clear external ear canal; notable landmarks visualized on the tympanic membrane.    Tympanogram: Right ear: Normal external ear canal volume with normal middle ear pressure and tympanic membrane compliance (Type A). Left ear: Normal external ear canal volume with normal middle ear pressure and tympanic membrane compliance (Type A).    Hearing Evaluation: The audiogram was completed using conventional audiometric techniques under headphones with good reliability.   The hearing test results indicate: Right ear: Normal hearing sensitivity from [phone] sloping to moderately-severe sensorineural hearing loss from 3000-8000 Hz. Left ear: Normal hearing sensitivity from [phone] sloping to moderately-severe sensorineural hearing loss from 3000-8000 Hz.  Speech Audiometry: Right ear- Speech Reception Threshold (SRT) was obtained at 25 dBHL. Left ear- Speech Reception Threshold (SRT) was obtained at 25 dBHL.   Word Recognition Score Tested using NU-6 (MLV) Right ear: 92% was obtained at a presentation level of  65 dBHL which is deemed as good understanding. Left ear: 92% was obtained at a presentation level of 65 dBHL which is deemed as good understanding.    Impression:  There is not a significant difference between puretone thresholds and word recognition scores between ears.   Recommendations: Repeat audiogram when changes are perceived or per MD.   Conley Rolls Cela Newcom, AUD, CCC-A 07/22/23

## 2023-07-22 NOTE — Progress Notes (Signed)
Dear Dr. Nedra Hai, Here is my assessment for our mutual patient, Joy Campbell. Thank you for allowing me the opportunity to care for your patient. Please do not hesitate to contact me should you have any other questions. Sincerely, Dr. Jovita Kussmaul  Otolaryngology Clinic Note Referring provider: Dr. Nedra Hai HPI:  Joy Campbell is a 72 y.o. female kindly referred by Dr. Nedra Hai for evaluation of bilateral tinnitus.   Initial visit (07/2023): Patient reports: she has bilateral tinnitus, ongoing for 6 months. Mostly high pitch. Not pulsatile. No autophony. Unclear antecedent event. She reports that this has gotten fair amount of improvement after stopping taking her OTC supplements. She reports she has not noticed much decline in hearing, no trouble socially or in noise she reports. Prior head turning/occassional vertigo but no sx currently Patient denies: ear pain, fullness, vertigo, drainage  Patient additionally denies: deep pain in ear canal, eustachian tube symptoms such as popping, crackling, sensitivity to pressure changes Patient also denies barotrauma, vestibular suppressant use, ototoxic medication use Prior ear surgery: no No significant noise exposure or FHx  She takes several OTC supplements.   H&N Surgery: no Personal or FHx of bleeding dz or anesthesia difficulty: no  AP/AC: no  Tobacco: no. Alcohol: no. 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 Lives in [place].  PMHx: Insomnia, HTN, Migraines (not anymore), Allergies, Anxiety, GERD, OA  Independent Review of Additional Tests or Records:  07/22/2023 Audiogram was independently reviewed and interpreted by me and it reveals AU: symmetric downsloping HL; good WRT as below; A/A tymps    SNHL= Sensorineural hearing loss  Referral notes (Dr. Nedra Hai 05/2023): Noted dizziness but no complaint for it; referred to ENT for eval   PMH/Meds/All/SocHx/FamHx/ROS:  PMHx/All/H&N Surgical  history/SOC Hx updated as above   Current Outpatient Medications:    ALPRAZolam (XANAX) 1 MG tablet, Take 1 tablet by mouth every 8 (eight) hours as needed., Disp: , Rfl:    amLODipine (NORVASC) 2.5 MG tablet, Take 2.5 mg by mouth daily., Disp: , Rfl:    amLODipine (NORVASC) 5 MG tablet, Take 5 mg by mouth daily., Disp: , Rfl:    Physical Exam:   Ht 5\' 2"  (1.575 m)   Wt 140 lb (63.5 kg)   BMI 25.61 kg/m   Salient findings:  CN II-XII intact No gross nystagmus  Bilateral EAC clear and TM intact with well pneumatized middle ear spaces Weber 512: mid Rinne 512: AC > BC b/l Anterior rhinoscopy: Septum relatively midline; no masses noted No lesions of oral cavity/oropharynx No obviously palpable neck masses/lymphadenopathy/thyromegaly No respiratory distress or stridor  Seprately Identifiable Procedures:  None  Impression & Plans:  Joy Campbell is a 72 y.o. female with:  1. Bilateral tinnitus   2. Sensorineural hearing loss (SNHL), bilateral    Tinnitus has resolved after discontinuing OTC supplement, so suspect supplement related. Not pulsatile. 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 Discussed management, and given improvement, she would like to watch. Can consider masking strategies as well D/w HA and repeat HT but declined F/u PRN   See below regarding exact medications prescribed this encounter including dosages and route: No orders of the defined types were placed in this encounter.     Thank you for allowing me the opportunity to care for your patient. Please do not hesitate to contact me should you have any other questions.  Sincerely, Jovita Kussmaul, MD Otolarynoglogist (ENT), [facility] Phone: [phone]  Fax: [phone]  08/07/2023, 9:40 AM   MDM:  Level 99203 Complexity/Problems addressed: low Data complexity: low - independent review of notes - Morbidity: low  - Prescription Drug prescribed or managed:  no

## 2023-07-23 ENCOUNTER — Institutional Professional Consult (permissible substitution) (INDEPENDENT_AMBULATORY_CARE_PROVIDER_SITE_OTHER): Payer: Self-pay | Admitting: Otolaryngology
